# Patient Record
Sex: Female | Born: 1940 | Race: Black or African American | Hispanic: No | Marital: Married | Smoking: Never smoker
Health system: Southern US, Community
[De-identification: ages and names within clinical notes are randomized; demographics above are authoritative.]

## PROBLEM LIST (undated history)

## (undated) DIAGNOSIS — Z860100 Personal history of colon polyps, unspecified: Secondary | ICD-10-CM

## (undated) DIAGNOSIS — C50912 Malignant neoplasm of unspecified site of left female breast: Secondary | ICD-10-CM

## (undated) DIAGNOSIS — D0510 Intraductal carcinoma in situ of unspecified breast: Secondary | ICD-10-CM

## (undated) DIAGNOSIS — I10 Essential (primary) hypertension: Secondary | ICD-10-CM

## (undated) DIAGNOSIS — Z923 Personal history of irradiation: Secondary | ICD-10-CM

## (undated) DIAGNOSIS — H9190 Unspecified hearing loss, unspecified ear: Secondary | ICD-10-CM

## (undated) DIAGNOSIS — E785 Hyperlipidemia, unspecified: Secondary | ICD-10-CM

## (undated) DIAGNOSIS — Z972 Presence of dental prosthetic device (complete) (partial): Secondary | ICD-10-CM

## (undated) DIAGNOSIS — C50919 Malignant neoplasm of unspecified site of unspecified female breast: Secondary | ICD-10-CM

## (undated) DIAGNOSIS — R7303 Prediabetes: Secondary | ICD-10-CM

## (undated) DIAGNOSIS — M8580 Other specified disorders of bone density and structure, unspecified site: Secondary | ICD-10-CM

## (undated) DIAGNOSIS — K5900 Constipation, unspecified: Secondary | ICD-10-CM

## (undated) DIAGNOSIS — Z973 Presence of spectacles and contact lenses: Secondary | ICD-10-CM

## (undated) HISTORY — PX: COLONOSCOPY: SHX174

## (undated) HISTORY — DX: Prediabetes: R73.03

## (undated) HISTORY — DX: Intraductal carcinoma in situ of unspecified breast: D05.10

## (undated) HISTORY — DX: Malignant neoplasm of unspecified site of unspecified female breast: C50.919

## (undated) HISTORY — DX: Other specified disorders of bone density and structure, unspecified site: M85.80

## (undated) HISTORY — DX: Constipation, unspecified: K59.00

## (undated) HISTORY — DX: Hyperlipidemia, unspecified: E78.5

## (undated) HISTORY — PX: BREAST LUMPECTOMY: SHX2

## (undated) HISTORY — DX: Personal history of colon polyps, unspecified: Z86.0100

## (undated) HISTORY — DX: Personal history of colonic polyps: Z86.010

## (undated) HISTORY — PX: ABDOMINAL HYSTERECTOMY: SHX81

## (undated) HISTORY — DX: Unspecified hearing loss, unspecified ear: H91.90

---

## 1999-12-28 ENCOUNTER — Encounter: Admission: RE | Admit: 1999-12-28 | Discharge: 1999-12-28 | Payer: Self-pay | Admitting: Family Medicine

## 1999-12-28 ENCOUNTER — Encounter: Payer: Self-pay | Admitting: Family Medicine

## 2000-04-28 ENCOUNTER — Encounter: Payer: Self-pay | Admitting: Family Medicine

## 2000-04-28 ENCOUNTER — Encounter: Admission: RE | Admit: 2000-04-28 | Discharge: 2000-04-28 | Payer: Self-pay | Admitting: Family Medicine

## 2000-09-08 ENCOUNTER — Encounter (INDEPENDENT_AMBULATORY_CARE_PROVIDER_SITE_OTHER): Payer: Self-pay

## 2000-09-08 ENCOUNTER — Ambulatory Visit (HOSPITAL_COMMUNITY): Admission: RE | Admit: 2000-09-08 | Discharge: 2000-09-08 | Payer: Self-pay | Admitting: Gastroenterology

## 2001-04-30 ENCOUNTER — Encounter: Payer: Self-pay | Admitting: Family Medicine

## 2001-04-30 ENCOUNTER — Encounter: Admission: RE | Admit: 2001-04-30 | Discharge: 2001-04-30 | Payer: Self-pay | Admitting: Family Medicine

## 2001-10-13 ENCOUNTER — Emergency Department (HOSPITAL_COMMUNITY): Admission: EM | Admit: 2001-10-13 | Discharge: 2001-10-13 | Payer: Self-pay | Admitting: Emergency Medicine

## 2002-06-18 ENCOUNTER — Encounter: Payer: Self-pay | Admitting: Family Medicine

## 2002-06-18 ENCOUNTER — Encounter: Admission: RE | Admit: 2002-06-18 | Discharge: 2002-06-18 | Payer: Self-pay | Admitting: Family Medicine

## 2003-06-21 ENCOUNTER — Encounter: Payer: Self-pay | Admitting: Family Medicine

## 2003-06-21 ENCOUNTER — Encounter: Admission: RE | Admit: 2003-06-21 | Discharge: 2003-06-21 | Payer: Self-pay | Admitting: Family Medicine

## 2003-11-01 ENCOUNTER — Emergency Department (HOSPITAL_COMMUNITY): Admission: EM | Admit: 2003-11-01 | Discharge: 2003-11-01 | Payer: Self-pay | Admitting: Emergency Medicine

## 2004-05-22 ENCOUNTER — Ambulatory Visit (HOSPITAL_COMMUNITY): Admission: RE | Admit: 2004-05-22 | Discharge: 2004-05-22 | Payer: Self-pay | Admitting: Gastroenterology

## 2004-05-22 ENCOUNTER — Encounter (INDEPENDENT_AMBULATORY_CARE_PROVIDER_SITE_OTHER): Payer: Self-pay | Admitting: *Deleted

## 2004-06-28 ENCOUNTER — Encounter: Admission: RE | Admit: 2004-06-28 | Discharge: 2004-06-28 | Payer: Self-pay | Admitting: Family Medicine

## 2005-07-12 ENCOUNTER — Encounter: Admission: RE | Admit: 2005-07-12 | Discharge: 2005-07-12 | Payer: Self-pay | Admitting: Family Medicine

## 2006-07-14 ENCOUNTER — Encounter: Admission: RE | Admit: 2006-07-14 | Discharge: 2006-07-14 | Payer: Self-pay | Admitting: Gynecology

## 2006-07-23 ENCOUNTER — Encounter: Admission: RE | Admit: 2006-07-23 | Discharge: 2006-07-23 | Payer: Self-pay | Admitting: Gynecology

## 2007-07-27 ENCOUNTER — Encounter: Admission: RE | Admit: 2007-07-27 | Discharge: 2007-07-27 | Payer: Self-pay | Admitting: Family Medicine

## 2008-07-27 ENCOUNTER — Encounter: Admission: RE | Admit: 2008-07-27 | Discharge: 2008-07-27 | Payer: Self-pay | Admitting: Family Medicine

## 2009-07-28 ENCOUNTER — Encounter: Admission: RE | Admit: 2009-07-28 | Discharge: 2009-07-28 | Payer: Self-pay | Admitting: Family Medicine

## 2010-07-30 ENCOUNTER — Encounter: Admission: RE | Admit: 2010-07-30 | Discharge: 2010-07-30 | Payer: Self-pay | Admitting: Family Medicine

## 2010-11-03 ENCOUNTER — Encounter: Payer: Self-pay | Admitting: Gynecology

## 2011-03-01 NOTE — Op Note (Signed)
NAME:  Linda Singh, Linda Singh                           ACCOUNT NO.:  [id number]   MEDICAL RECORD NO.:  [id number]                   PATIENT TYPE:  AMB   LOCATION:  ENDO                                 FACILITY:  [facility]   PHYSICIAN:  Petra Kuba, M.D.                 DATE OF BIRTH:  1941/08/13   DATE OF PROCEDURE:  05/22/2004  DATE OF DISCHARGE:                                 OPERATIVE REPORT   PROCEDURE:  Colonoscopy with polypectomy.   INDICATIONS:  Family history of colon cancer and colon polyps.  Due for  repeat screening.   Consent was signed after risks, benefits, methods, options thoroughly in the  office.   MEDICINES USED:  Demerol 60, Versed 6.   PROCEDURE:  Rectal inspection is pertinent for external hemorrhoids, small.  Digital exam was negative.  The video pediatric adjustable colonoscope was  inserted and easily advanced around the colon to the cecum.  This did not  require any abdominal pressure or any position changes.  No abnormality was  seen on insertion.  The cecum was identified by the appendiceal orifice and  ileocecal valve.  The scope was slowly withdrawn.  The cecum and the  ascending were normal.  In the hepatic flexure, two tiny polyps were seen  and were hot biopsied x1 and put in the same container.  The scope was  slowly withdrawn.  No additional findings were seen as we slowly withdrew  back to the rectum.  Prep was adequate.  There was some liquid stool that  required washing and suctioning.  Anorectal pull-through and retroflexion  confirmed some small hemorrhoids.  The scope was reinserted a shortways up  the left side of the colon.  Air was suctioned.  The scope removed.  The  patient tolerated the procedure well.  There was no obvious immediate  complication.   ENDOSCOPIC DIAGNOSES:  1. Small internal/external hemorrhoid.  2. Two tiny hepatic flexure polyps, biopsied.  3. Otherwise within normal limits to the cecum.   PLAN:  Await pathology.   Probably recheck colon screening in five years.  Happy to see back p.r.n.  Otherwise return to the care of Dr. Gerri Spore for  the customary health-care maintenance to include yearly rectals and guaiacs.                                               Petra Kuba, M.D.    MEM/MEDQ  D:  05/22/2004  T:  05/22/2004  Job:  [id number]   cc:   Otilio Connors. Gerri Spore, M.D.  [address]  [place]  [place] [address]  Fax: [phone]

## 2011-03-01 NOTE — Procedures (Signed)
[facility]  Patient:    Linda Singh, Linda Singh                        MRN: [id number] Proc. Date: 09/08/00 Attending:  Petra Kuba, M.D.                           Procedure Report  PROCEDURE PERFORMED:  Colonoscopy with biopsy.  ENDOSCOPIST:  Petra Kuba, M.D.  INDICATIONS:  Family history of colon polyps in a patient due for colonic screening.  Consent was signed after risks, benefits, and options thoroughly discussed in the office.  MEDICATIONS:  Demerol 40 mg and Versed 4 mg.  FINDINGS:  Rectal inspection pertinent for small external hemorrhoids. Digital exam was negative.  DESCRIPTION OF PROCEDURE:  The video pediatric colonoscope was inserted very easily and advanced around the colon to the cecum.  This did not require any abdominal pressure, or any position changes.  The cecum was identified by the appendiceal orifice and the ileocecal valve.  Other than a rare diverticula on both the left and right side, no other obvious abnormalities were seen on insertion.  The prep was adequate.  There was some liquid stool that required washing and sectioning.  The scope was slowly withdrawn.  In the mid ascending and proximal transverse, two questionable tiny small polyps were seen and awaits cold biopsy x 2 or 3, and put the first container.  The scope was slowly withdrawn.  In the mid descending, a 2 mm polyp was seen and was cold biopsied x 2 and put in a second container.  Other than diverticula mentioned above, no abnormalities were seen as we slowly withdrew back to the rectum. Once back in the rectum, the scope was retroflexed, pertinent for some internal hemorrhoids.  The scope was drained and readvanced up the sigmoid area with the section of the scope removed.  The patient tolerated the procedure well.  There was no obvious immediate complication.  ENDOSCOPIC DIAGNOSES: 1. Internal/external hemorrhoids. 2. Rare left and right-sided  diverticula. 3. Questionable tiny to small polyps, one in the descending, cold    biopsy put in the second container and one each in the    transverse and ascending.  Cold biopsy first in the first    container. 4. Otherwise within normal limits.  PLAN:  Await pathology, but probably recheck colon screening in five years. Happy to see back p.r.n., otherwise return care to Dr. Clyde Canterbury for the customary yearly rectals, guaiacs, and other health care maintenance. DD:  09/08/00 TD:  09/08/00 Job: [id number]

## 2011-04-16 ENCOUNTER — Other Ambulatory Visit: Payer: Self-pay | Admitting: Family Medicine

## 2011-04-16 ENCOUNTER — Ambulatory Visit
Admission: RE | Admit: 2011-04-16 | Discharge: 2011-04-16 | Disposition: A | Discharge status: Home / Self Care | Payer: Medicare Other | Source: Ambulatory Visit | Attending: Family Medicine | Admitting: Family Medicine

## 2011-04-16 DIAGNOSIS — S838X9A Sprain of other specified parts of unspecified knee, initial encounter: Secondary | ICD-10-CM

## 2011-04-16 DIAGNOSIS — S86819A Strain of other muscle(s) and tendon(s) at lower leg level, unspecified leg, initial encounter: Secondary | ICD-10-CM

## 2011-07-01 ENCOUNTER — Other Ambulatory Visit: Payer: Self-pay | Admitting: Family Medicine

## 2011-07-01 DIAGNOSIS — Z1231 Encounter for screening mammogram for malignant neoplasm of breast: Secondary | ICD-10-CM

## 2011-08-01 ENCOUNTER — Ambulatory Visit
Admission: RE | Admit: 2011-08-01 | Discharge: 2011-08-01 | Disposition: A | Discharge status: Home / Self Care | Payer: Medicare Other | Source: Ambulatory Visit | Attending: Family Medicine | Admitting: Family Medicine

## 2011-08-01 DIAGNOSIS — Z1231 Encounter for screening mammogram for malignant neoplasm of breast: Secondary | ICD-10-CM

## 2012-01-03 DIAGNOSIS — I10 Essential (primary) hypertension: Secondary | ICD-10-CM | POA: Diagnosis not present

## 2012-01-03 DIAGNOSIS — L301 Dyshidrosis [pompholyx]: Secondary | ICD-10-CM | POA: Diagnosis not present

## 2012-01-03 DIAGNOSIS — F430 Acute stress reaction: Secondary | ICD-10-CM | POA: Diagnosis not present

## 2012-01-03 DIAGNOSIS — Z79899 Other long term (current) drug therapy: Secondary | ICD-10-CM | POA: Diagnosis not present

## 2012-02-04 DIAGNOSIS — I10 Essential (primary) hypertension: Secondary | ICD-10-CM | POA: Diagnosis not present

## 2012-05-19 DIAGNOSIS — Z79899 Other long term (current) drug therapy: Secondary | ICD-10-CM | POA: Diagnosis not present

## 2012-05-19 DIAGNOSIS — Z0000 Encounter for general adult medical examination without abnormal findings: Secondary | ICD-10-CM | POA: Diagnosis not present

## 2012-05-19 DIAGNOSIS — T300 Burn of unspecified body region, unspecified degree: Secondary | ICD-10-CM | POA: Diagnosis not present

## 2012-05-19 DIAGNOSIS — I10 Essential (primary) hypertension: Secondary | ICD-10-CM | POA: Diagnosis not present

## 2012-06-05 DIAGNOSIS — R7309 Other abnormal glucose: Secondary | ICD-10-CM | POA: Diagnosis not present

## 2012-06-05 DIAGNOSIS — E876 Hypokalemia: Secondary | ICD-10-CM | POA: Diagnosis not present

## 2012-06-30 ENCOUNTER — Other Ambulatory Visit: Payer: Self-pay | Admitting: Family Medicine

## 2012-06-30 DIAGNOSIS — Z1231 Encounter for screening mammogram for malignant neoplasm of breast: Secondary | ICD-10-CM

## 2012-08-03 ENCOUNTER — Ambulatory Visit
Admission: RE | Admit: 2012-08-03 | Discharge: 2012-08-03 | Disposition: A | Discharge status: Home / Self Care | Payer: Medicare Other | Source: Ambulatory Visit | Attending: Family Medicine | Admitting: Family Medicine

## 2012-08-03 DIAGNOSIS — Z1231 Encounter for screening mammogram for malignant neoplasm of breast: Secondary | ICD-10-CM | POA: Diagnosis not present

## 2012-08-07 DIAGNOSIS — I10 Essential (primary) hypertension: Secondary | ICD-10-CM | POA: Diagnosis not present

## 2012-09-29 DIAGNOSIS — R6889 Other general symptoms and signs: Secondary | ICD-10-CM | POA: Diagnosis not present

## 2012-11-20 DIAGNOSIS — Z79899 Other long term (current) drug therapy: Secondary | ICD-10-CM | POA: Diagnosis not present

## 2012-11-20 DIAGNOSIS — I10 Essential (primary) hypertension: Secondary | ICD-10-CM | POA: Diagnosis not present

## 2013-05-11 DIAGNOSIS — B079 Viral wart, unspecified: Secondary | ICD-10-CM | POA: Diagnosis not present

## 2013-06-30 ENCOUNTER — Other Ambulatory Visit: Payer: Self-pay

## 2013-06-30 DIAGNOSIS — Z1231 Encounter for screening mammogram for malignant neoplasm of breast: Secondary | ICD-10-CM

## 2013-08-04 ENCOUNTER — Ambulatory Visit
Admission: RE | Admit: 2013-08-04 | Discharge: 2013-08-04 | Disposition: A | Discharge status: Home / Self Care | Payer: Medicare Other | Source: Ambulatory Visit

## 2013-08-04 DIAGNOSIS — Z1231 Encounter for screening mammogram for malignant neoplasm of breast: Secondary | ICD-10-CM | POA: Diagnosis not present

## 2013-10-29 DIAGNOSIS — M25569 Pain in unspecified knee: Secondary | ICD-10-CM | POA: Diagnosis not present

## 2013-11-09 DIAGNOSIS — I10 Essential (primary) hypertension: Secondary | ICD-10-CM | POA: Diagnosis not present

## 2013-11-09 DIAGNOSIS — Z0000 Encounter for general adult medical examination without abnormal findings: Secondary | ICD-10-CM | POA: Diagnosis not present

## 2013-11-09 DIAGNOSIS — Z780 Asymptomatic menopausal state: Secondary | ICD-10-CM | POA: Diagnosis not present

## 2013-11-09 DIAGNOSIS — E785 Hyperlipidemia, unspecified: Secondary | ICD-10-CM | POA: Diagnosis not present

## 2013-11-19 DIAGNOSIS — H524 Presbyopia: Secondary | ICD-10-CM | POA: Diagnosis not present

## 2013-11-19 DIAGNOSIS — H2589 Other age-related cataract: Secondary | ICD-10-CM | POA: Diagnosis not present

## 2013-11-19 DIAGNOSIS — H52229 Regular astigmatism, unspecified eye: Secondary | ICD-10-CM | POA: Diagnosis not present

## 2013-11-19 DIAGNOSIS — H5200 Hypermetropia, unspecified eye: Secondary | ICD-10-CM | POA: Diagnosis not present

## 2013-12-06 DIAGNOSIS — M810 Age-related osteoporosis without current pathological fracture: Secondary | ICD-10-CM | POA: Diagnosis not present

## 2013-12-06 DIAGNOSIS — E8941 Symptomatic postprocedural ovarian failure: Secondary | ICD-10-CM | POA: Diagnosis not present

## 2013-12-16 DIAGNOSIS — J329 Chronic sinusitis, unspecified: Secondary | ICD-10-CM | POA: Diagnosis not present

## 2014-02-14 DIAGNOSIS — M79609 Pain in unspecified limb: Secondary | ICD-10-CM | POA: Diagnosis not present

## 2014-02-14 DIAGNOSIS — M25469 Effusion, unspecified knee: Secondary | ICD-10-CM | POA: Diagnosis not present

## 2014-03-22 DIAGNOSIS — J029 Acute pharyngitis, unspecified: Secondary | ICD-10-CM | POA: Diagnosis not present

## 2014-07-05 ENCOUNTER — Other Ambulatory Visit: Payer: Self-pay

## 2014-07-05 DIAGNOSIS — Z1231 Encounter for screening mammogram for malignant neoplasm of breast: Secondary | ICD-10-CM

## 2014-08-05 ENCOUNTER — Ambulatory Visit: Payer: Medicare Other

## 2014-08-15 ENCOUNTER — Ambulatory Visit: Payer: Medicare Other

## 2014-08-22 DIAGNOSIS — Z09 Encounter for follow-up examination after completed treatment for conditions other than malignant neoplasm: Secondary | ICD-10-CM | POA: Diagnosis not present

## 2014-08-22 DIAGNOSIS — K648 Other hemorrhoids: Secondary | ICD-10-CM | POA: Diagnosis not present

## 2014-08-22 DIAGNOSIS — Z86010 Personal history of colonic polyps: Secondary | ICD-10-CM | POA: Diagnosis not present

## 2014-08-30 ENCOUNTER — Ambulatory Visit: Payer: Medicare Other

## 2014-09-20 ENCOUNTER — Ambulatory Visit
Admission: RE | Admit: 2014-09-20 | Discharge: 2014-09-20 | Disposition: A | Discharge status: Home / Self Care | Payer: Medicare Other | Source: Ambulatory Visit

## 2014-09-20 DIAGNOSIS — Z1231 Encounter for screening mammogram for malignant neoplasm of breast: Secondary | ICD-10-CM | POA: Diagnosis not present

## 2014-09-22 ENCOUNTER — Other Ambulatory Visit: Payer: Self-pay | Admitting: Family Medicine

## 2014-09-22 DIAGNOSIS — R928 Other abnormal and inconclusive findings on diagnostic imaging of breast: Secondary | ICD-10-CM

## 2014-09-27 ENCOUNTER — Other Ambulatory Visit: Payer: Self-pay | Admitting: Family Medicine

## 2014-09-27 ENCOUNTER — Ambulatory Visit
Admission: RE | Admit: 2014-09-27 | Discharge: 2014-09-27 | Disposition: A | Discharge status: Home / Self Care | Payer: Medicare Other | Source: Ambulatory Visit | Attending: Family Medicine | Admitting: Family Medicine

## 2014-09-27 DIAGNOSIS — R928 Other abnormal and inconclusive findings on diagnostic imaging of breast: Secondary | ICD-10-CM

## 2014-09-27 DIAGNOSIS — R921 Mammographic calcification found on diagnostic imaging of breast: Secondary | ICD-10-CM | POA: Diagnosis not present

## 2014-10-11 ENCOUNTER — Ambulatory Visit
Admission: RE | Admit: 2014-10-11 | Discharge: 2014-10-11 | Disposition: A | Discharge status: Home / Self Care | Payer: Medicare Other | Source: Ambulatory Visit | Attending: Family Medicine | Admitting: Family Medicine

## 2014-10-11 DIAGNOSIS — D0512 Intraductal carcinoma in situ of left breast: Secondary | ICD-10-CM | POA: Diagnosis not present

## 2014-10-11 DIAGNOSIS — R928 Other abnormal and inconclusive findings on diagnostic imaging of breast: Secondary | ICD-10-CM

## 2014-10-11 DIAGNOSIS — R921 Mammographic calcification found on diagnostic imaging of breast: Secondary | ICD-10-CM | POA: Diagnosis not present

## 2014-10-12 ENCOUNTER — Other Ambulatory Visit: Payer: Self-pay | Admitting: Family Medicine

## 2014-10-12 DIAGNOSIS — D0512 Intraductal carcinoma in situ of left breast: Secondary | ICD-10-CM

## 2014-10-13 ENCOUNTER — Telehealth: Payer: Self-pay | Admitting: *Deleted

## 2014-10-13 DIAGNOSIS — C50412 Malignant neoplasm of upper-outer quadrant of left female breast: Secondary | ICD-10-CM

## 2014-10-13 DIAGNOSIS — Z170 Estrogen receptor positive status [ER+]: Secondary | ICD-10-CM | POA: Insufficient documentation

## 2014-10-13 NOTE — Telephone Encounter (Signed)
Confirmed [facility] for 10/19/14 at 12N .  Instructions and contact information given.

## 2014-10-18 ENCOUNTER — Other Ambulatory Visit: Payer: Self-pay | Admitting: Family Medicine

## 2014-10-18 ENCOUNTER — Ambulatory Visit
Admission: RE | Admit: 2014-10-18 | Discharge: 2014-10-18 | Disposition: A | Discharge status: Home / Self Care | Payer: Medicare Other | Source: Ambulatory Visit | Attending: Family Medicine | Admitting: Family Medicine

## 2014-10-18 DIAGNOSIS — D0512 Intraductal carcinoma in situ of left breast: Secondary | ICD-10-CM

## 2014-10-18 MED ORDER — GADOBENATE DIMEGLUMINE 529 MG/ML IV SOLN: 16.0000 mL | Freq: Once | INTRAVENOUS | Status: AC | PRN

## 2014-10-19 ENCOUNTER — Encounter: Payer: Self-pay | Admitting: Oncology

## 2014-10-19 ENCOUNTER — Ambulatory Visit: Payer: Medicare Other

## 2014-10-19 ENCOUNTER — Other Ambulatory Visit (INDEPENDENT_AMBULATORY_CARE_PROVIDER_SITE_OTHER): Payer: Self-pay | Admitting: General Surgery

## 2014-10-19 ENCOUNTER — Ambulatory Visit
Admission: RE | Admit: 2014-10-19 | Discharge: 2014-10-19 | Disposition: A | Discharge status: Home / Self Care | Payer: Medicare Other | Source: Ambulatory Visit | Attending: Radiation Oncology | Admitting: Radiation Oncology

## 2014-10-19 ENCOUNTER — Other Ambulatory Visit (HOSPITAL_BASED_OUTPATIENT_CLINIC_OR_DEPARTMENT_OTHER): Payer: Medicare Other

## 2014-10-19 ENCOUNTER — Ambulatory Visit (HOSPITAL_BASED_OUTPATIENT_CLINIC_OR_DEPARTMENT_OTHER): Payer: Medicare Other | Admitting: Oncology

## 2014-10-19 VITALS — BP 156/96 | HR 75 | Temp 98.3°F | Resp 18 | Ht 66.0 in | Wt 180.7 lb

## 2014-10-19 DIAGNOSIS — C50412 Malignant neoplasm of upper-outer quadrant of left female breast: Secondary | ICD-10-CM

## 2014-10-19 DIAGNOSIS — Z170 Estrogen receptor positive status [ER+]: Secondary | ICD-10-CM | POA: Diagnosis not present

## 2014-10-19 DIAGNOSIS — N641 Fat necrosis of breast: Secondary | ICD-10-CM | POA: Diagnosis not present

## 2014-10-19 DIAGNOSIS — C50912 Malignant neoplasm of unspecified site of left female breast: Secondary | ICD-10-CM

## 2014-10-19 DIAGNOSIS — D0512 Intraductal carcinoma in situ of left breast: Secondary | ICD-10-CM

## 2014-10-19 LAB — COMPREHENSIVE METABOLIC PANEL (CC13)
ALT: 11 U/L (ref 0–55)
ANION GAP: 9 meq/L (ref 3–11)
AST: 23 U/L (ref 5–34)
Albumin: 4 g/dL (ref 3.5–5.0)
Alkaline Phosphatase: 62 U/L (ref 40–150)
BUN: 14.5 mg/dL (ref 7.0–26.0)
CALCIUM: 9.5 mg/dL (ref 8.4–10.4)
CHLORIDE: 103 meq/L (ref 98–109)
CO2: 27 mEq/L (ref 22–29)
CREATININE: 0.9 mg/dL (ref 0.6–1.1)
EGFR: 74 mL/min/{1.73_m2} — AB (ref >90)
Glucose: 101 mg/dl (ref 70–140)
Potassium: 3.5 mEq/L (ref 3.5–5.1)
Sodium: 139 mEq/L (ref 136–145)
Total Bilirubin: 0.89 mg/dL (ref 0.20–1.20)
Total Protein: 7.1 g/dL (ref 6.4–8.3)

## 2014-10-19 LAB — CBC WITH DIFFERENTIAL/PLATELET
BASO%: 0.6 % (ref 0.0–2.0)
Basophils Absolute: 0 10*3/uL (ref 0.0–0.1)
EOS%: 2.3 % (ref 0.0–7.0)
Eosinophils Absolute: 0.1 10*3/uL (ref 0.0–0.5)
HCT: 43.4 % (ref 34.8–46.6)
HGB: 14.1 g/dL (ref 11.6–15.9)
LYMPH%: 27.3 % (ref 14.0–49.7)
MCH: 30.1 pg (ref 25.1–34.0)
MCHC: 32.4 g/dL (ref 31.5–36.0)
MCV: 92.8 fL (ref 79.5–101.0)
MONO#: 0.5 10*3/uL (ref 0.1–0.9)
MONO%: 8.4 % (ref 0.0–14.0)
NEUT#: 4 10*3/uL (ref 1.5–6.5)
NEUT%: 61.4 % (ref 38.4–76.8)
Platelets: 219 10*3/uL (ref 145–400)
RBC: 4.68 10*6/uL (ref 3.70–5.45)
RDW: 13.5 % (ref 11.2–14.5)
WBC: 6.5 10*3/uL (ref 3.9–10.3)
lymph#: 1.8 10*3/uL (ref 0.9–3.3)

## 2014-10-19 NOTE — Progress Notes (Signed)
Checked in new pt with no financial concerns at this time.  Pt has 2 insurances so financial assistance may not be needed but she has my card for any billing or insurance questions or concerns.

## 2014-10-19 NOTE — Progress Notes (Signed)
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 female who is seen today through the courtesy of Dr. Barry Dienes at the multidisciplinary breast clinic for evaluation of her DCIS of the left breast.  At the time of a mammogram on 09/20/2014 she was found to have calcifications within the left breast.  Additional views on December 15 showed calcifications within the upper-outer quadrant.  A biopsy on 10/11/2014 showed DCIS, intermediate grade with necrosis.  Her DCIS is ER/PR positive.  She attempted breast MR on 10/18/2014, but she became nauseated.  This may be rescheduled for January 8.  She is without complaints today.  Of note is that her son is a Education officer, environmental in [place].  She seen today with Dr. Barry Dienes and Dr. Jana Hakim.  PREVIOUS RADIATION THERAPY: No   PAST MEDICAL HISTORY:  has a past medical history of Breast cancer.     PAST SURGICAL HISTORY:  Past Surgical History  Procedure Laterality Date  . Abdominal hysterectomy       FAMILY HISTORY: Her father died of a heart attack following a motor vehicle accident at 4.  Her mother died from old age at 68.  SOCIAL HISTORY:  reports that she has never smoked. She does not have any smokeless tobacco history on file. She reports that she does not drink alcohol or use illicit drugs. Married, 2 sons.  One is a Customer service manager and the other a Education officer, environmental in [place]. 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   ALLERGIES: Amoxicillin and Contrast media   MEDICATIONS:  Current Outpatient Prescriptions  Medication Sig Dispense Refill  . hydrochlorothiazide (HYDRODIURIL) 25 MG tablet Take 25 mg by mouth daily.     No  current facility-administered medications for this encounter.     REVIEW OF SYSTEMS:  Pertinent items are noted in HPI.    PHYSICAL EXAM: Alert and oriented 74 year old after American female appearing much younger than her stated age.  Head and neck examination: Grossly unremarkable.  Nodes: Without palpable cervical, supraclavicular, or axillary lymphadenopathy.  Chest: Lungs clear.  Breasts: There is a biopsy wound at approximately 1:00 along the superior aspect of left breast with ecchymosis along the superior breast.  There is no discrete mass.  Right breast without masses or lesions.  Extremities: Without edema.    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  We discussed management options which include mastectomy versus partial mastectomy.  Considering her young physiologic age and knowing that her mother lived to be over 18, I would  offer her radiation therapy following conservative surgery.  This would be purely given to improve local control but would have no impact on overall survival or mastectomy rate.  An alternative would be for her to take antiestrogen therapy for 5 years in place of radiation therapy.  We discussed the potential acute and late toxicities of radiation therapy.  Prior to radiation therapy I would repeat her mammogram to confirm removal of all suspicious microcalcifications.  She may be a candidate for hypofractionated radiation therapy along with deep inspiration breath-hold technology.  Her prognosis is excellent.   PLAN: As discussed  above.  I can see her following her definitive surgery.    I spent 30  minutes face to face with the patient and more than 50% of that time was spent in counseling and/or coordination of care.

## 2014-10-19 NOTE — Progress Notes (Signed)
Linda Singh  Telephone:([phone]) [phone] Fax:(336) [phone]     ID: Linda Singh DOB: [birth year]  MR#: [id number]  TGG#:[id number]  Patient Care Team: London Pepper, MD as PCP - General (Family Medicine) Stark Klein, MD as Consulting Physician (General Surgery) Rexene Edison, MD as Consulting Physician (Radiation Oncology) Chauncey Cruel, MD as Consulting Physician (Oncology) Trinda Pascal, NP as Nurse Practitioner (Nurse Practitioner) Roselee Culver, RN as Registered Nurse [facility] Caleen Jobs, RN as Registered Nurse OTHER MD:  CHIEF COMPLAINT: Ductal carcinoma in situ  CURRENT TREATMENT: Awaiting definitive surgery   BREAST CANCER HISTORY: Linda Singh had routine screening mammography at the breast Center 09/20/2014 going showing her breast density to be category B. In the left breast new calcifications were noted, and these were further evaluated with left diagnostic mammography 09/27/2014. The calcifications when the upper outer quadrant, and were mildly irregular and linear. Biopsy was performed 10/11/2014 and showed ([id number]) ductal carcinoma in situ, grade 2, estrogen receptor 100% positive, progesterone receptor 30% positive, both with strong staining intensity.  MRI was scheduled but the patient was unable to tolerate it.  Her subsequent history is as detailed below  INTERVAL HISTORY: Linda Singh was evaluated in the multidisciplinary breast cancer clinic 10/19/2014. Her case was also presented at the multidisciplinary breast cancer conference that same morning  REVIEW OF SYSTEMS: There were no specific symptoms leading to the original mammogram, which was routinely scheduled. The patient denies unusual headaches, visual changes, nausea, vomiting, stiff neck, dizziness, or gait imbalance. There has been no cough, phlegm production, or pleurisy, no chest pain or pressure, and no change in bowel or bladder habits. The patient denies fever, rash,  bleeding, unexplained fatigue or unexplained weight loss. A detailed review of systems was otherwise entirely negative.  PAST MEDICAL HISTORY: Past Medical History  Diagnosis Date  . Breast cancer     PAST SURGICAL HISTORY: Past Surgical History  Procedure Laterality Date  . Abdominal hysterectomy      FAMILY HISTORY History reviewed. No pertinent family history. The patient's father died in an automobile accident at age 63. The patient's mother died at age 3. The patient had 2 brothers, 6 sisters. There is no history of breast or ovarian cancer in the family.  GYNECOLOGIC HISTORY:  No LMP recorded. Patient is postmenopausal. Menarche age 40. First live birth age 73. The patient is GX P3. She status post total abdominal hysterectomy with bilateral salpingo-oophorectomy. She did not take hormone replacement. 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 Her son Linda Singh works as a Psychologist, sport and exercise in [place]. Her son Linda Singh is Quarry manager. of [employer] in [place]. The patient has 3 grandchildren. She attends [employer]    ADVANCED DIRECTIVES: Not in place   HEALTH MAINTENANCE: History  Substance Use Topics  . Smoking status: Never Smoker   . Smokeless tobacco: Not on file  . Alcohol Use: No     Colonoscopy: 2015/[facility]  PAP: Status post hysterectomy  Bone density:  Lipid panel:  Allergies  Allergen Reactions  . Amoxicillin   . Contrast Media [Iodinated Diagnostic Agents] Nausea And Vomiting    Current Outpatient Prescriptions  Medication Sig Dispense Refill  . hydrochlorothiazide (HYDRODIURIL) 25 MG tablet Take 25 mg by mouth daily.     No current facility-administered medications for this visit.    OBJECTIVE:  Older African-American woman who appears younger than stated age 21 Vitals:   10/19/14  1250  BP: 156/96  Pulse: 75  Temp: 98.3 F (36.8 C)  Resp: 18     Body mass index is 29.18 kg/(m^2).    ECOG FS:0 - Asymptomatic  Ocular: Sclerae unicteric, pupils equal, round and reactive to light Ear-nose-throat: Oropharynx clear, dentition in good repair  Lymphatic: No cervical or supraclavicular adenopathy Lungs no rales or rhonchi, good excursion bilaterally Heart regular rate and rhythm, no murmur appreciated Abd soft, nontender, positive bowel sounds MSK no focal spinal tenderness, no joint edema Neuro: non-focal, well-oriented, appropriate affect Breasts: the right breast is unremarkable. The left breast is status post recent biopsy. There are no palpable masses. The left axilla is benign  LAB RESULTS:  CMP     Component Value Date/Time   NA 139 10/19/2014 1155   K 3.5 10/19/2014 1155   CO2 27 10/19/2014 1155   GLUCOSE 101 10/19/2014 1155   BUN 14.5 10/19/2014 1155   CREATININE 0.9 10/19/2014 1155   CALCIUM 9.5 10/19/2014 1155   PROT 7.1 10/19/2014 1155   ALBUMIN 4.0 10/19/2014 1155   AST 23 10/19/2014 1155   ALT 11 10/19/2014 1155   ALKPHOS 62 10/19/2014 1155   BILITOT 0.89 10/19/2014 1155    INo results found for: SPEP, UPEP  Lab Results  Component Value Date   WBC 6.5 10/19/2014   NEUTROABS 4.0 10/19/2014   HGB 14.1 10/19/2014   HCT 43.4 10/19/2014   MCV 92.8 10/19/2014   PLT 219 10/19/2014      Chemistry      Component Value Date/Time   NA 139 10/19/2014 1155   K 3.5 10/19/2014 1155   CO2 27 10/19/2014 1155   BUN 14.5 10/19/2014 1155   CREATININE 0.9 10/19/2014 1155      Component Value Date/Time   CALCIUM 9.5 10/19/2014 1155   ALKPHOS 62 10/19/2014 1155   AST 23 10/19/2014 1155   ALT 11 10/19/2014 1155   BILITOT 0.89 10/19/2014 1155       No results found for: LABCA2  No components found for: LABCA125  No results for input(s): INR in the last 168 hours.  Urinalysis No results found for: COLORURINE, APPEARANCEUR, LABSPEC,  PHURINE, GLUCOSEU, HGBUR, BILIRUBINUR, KETONESUR, PROTEINUR, UROBILINOGEN, NITRITE, LEUKOCYTESUR  STUDIES: Mm Digital Diagnostic Unilat L  09/27/2014   CLINICAL DATA:  Call back from screening for left breast calcifications.  EXAM: DIGITAL DIAGNOSTIC  LEFT MAMMOGRAM  COMPARISON:  Prior exams  ACR Breast Density Category b: There are scattered areas of fibroglandular density.  FINDINGS: Calcifications in the upper outer left breast are mildly irregular. They are arranged a linear fashion suggesting a ductal distribution. There is no associated mass or distortion. Calcifications increased gradually since 2012.  IMPRESSION: Suspicious left breast calcifications.  Biopsy recommended.  RECOMMENDATION: Stereotactic core needle biopsy of the left breast calcifications.  I have discussed the findings and recommendations with the patient. Results were also provided in writing at the conclusion of the visit. If applicable, a reminder letter will be sent to the patient regarding the next appointment.  BI-RADS CATEGORY  4: Suspicious.   Electronically Signed   By: Lajean Manes M.D.   On: 09/27/2014 14:33   Mm Digital Screening Bilateral  09/20/2014   CLINICAL DATA:  Screening.  EXAM: DIGITAL SCREENING BILATERAL MAMMOGRAM WITH CAD  COMPARISON:  Previous Exam(s)  ACR Breast Density Category b: There are scattered areas of fibroglandular density.  FINDINGS:  In the left breast, calcifications warrant further evaluation with magnified views. In the right breast, no findings suspicious for malignancy. Images were processed with CAD.  IMPRESSION: Further evaluation is suggested for calcifications in the left breast.  RECOMMENDATION: Diagnostic mammogram of the left breast. (Code:[id number])  The patient will be contacted regarding the findings, and additional imaging will be scheduled.  BI-RADS CATEGORY  0: Incomplete. Need additional imaging evaluation and/or prior mammograms for comparison.   Electronically Signed   By: Lovey Newcomer M.D.   On: 09/20/2014 14:16   Mm Lt Breast Bx W Loc Dev 1st Lesion Image Bx Spec Stereo Guide  10/12/2014   ADDENDUM REPORT: 10/12/2014 14:04  ADDENDUM: Patient's pathology has returned. Patient has DCIS with necrosis and calcifications. This is concordant with imaging. Patient was given these results by phone on 10/12/2014. Patient's questions were answered. Patient son may be calling for additional information. Patient son is a Psychologist, sport and exercise. Patient has set up with a breast biopsy on 10/18/2014 and a multi disciplinary clinic appointment on 10/19/2014.  Patient had a small amount of bleeding last night has some discomfort today but no other complaints from the biopsy.   Electronically Signed   By: Lajean Manes M.D.   On: 10/12/2014 14:04   10/12/2014   CLINICAL DATA:  Indeterminate group of calcifications spanning approximately 3 cm in the upper-outer quadrant of the left breast, junction of anterior and middle depth, initially identified on recent screening mammography.  EXAM: LEFT BREAST STEREOTACTIC CORE NEEDLE BIOPSY  COMPARISON:  Previous exams.  FINDINGS: The patient and I discussed the procedure of stereotactic-guided biopsy including benefits and alternatives. We discussed the high likelihood of a successful procedure. We discussed the risks of the procedure including infection, bleeding, tissue injury, clip migration, and inadequate sampling. Informed written consent was given. The usual time out protocol was performed immediately prior to the procedure.  Using sterile technique and 2% Lidocaine as local anesthetic, under stereotactic guidance, a 9 gauge vacuum device was used to perform core needle biopsy of the calcifications in the upper-outer quadrant of the left breast using a lateral approach. Specimen radiograph was performed showing calcifications in at least 4 of the core samples. Specimens with calcifications are identified for pathology.  At the conclusion of the procedure, a coil  shaped tissue marker clip was deployed into the biopsy cavity. Follow-up 2-view mammogram confirmed clip placement at the anterior edge of the group of calcifications. There is evidence of a small post biopsy hematoma.  IMPRESSION: Stereotactic-guided biopsy of indeterminate calcifications in the upper-outer quadrant of the left breast, junction of anterior and middle depth. No apparent complications apart from a small hematoma at the biopsy site.  Electronically Signed: By: Evangeline Dakin M.D. On: 10/11/2014 12:14    ASSESSMENT: 74 y.o. [place] woman status post left breast upper outer quadrant biopsy 10/11/2014 showing ductal carcinoma in situ, grade 2, estrogen and progesterone receptor positive  (1) breast conservation surgery planned  (2) radiation oncology felt benefit from radiation would be marginal and could be omitted  (3) patient will start antiestrogen therapy after recovery from surgery  PLAN: We spent the better part of today's hour-long appointment discussing the biology of breast cancer in general, and the specifics of the patient's tumor in particular. Ms. Brazee understands that ductal carcinoma in situ, or noninvasive breast cancer, means of the cancer cells are trapped in the ducts or tubes where they started. They cannot travel to a vital organ. For that reason  this cancer in itself is not a life-threatening.  It does need to be removed and we recommend a lumpectomy in her case. Because of necrosis and grade 2 she will have a sentinel lymph node sampling as well. Assuming no invasive disease is found, we felt she would not need both radiation and antiestrogen therapy. The consensus was that she could skip the radiation and proceed directly to anti-estrogens once she recovered from her surgery.  Today we discussed briefly the possible toxicities, side effects and complications of anti-estrogens. We will make a definitive decision as to which agent to use once she returns  to see me in early March. Before that visit we will obtain a copy of her most recent bone density  The patient has a good understanding of the overall plan. She agrees with it. She knows the goal of treatment in her case is cure. She will call with any problems that may develop before her next visit here.  Chauncey Cruel, MD   10/19/2014 3:12 PM Medical Oncology and Hematology [facility] [address] [place], [place] [address] Tel. [phone]    Fax. [phone]

## 2014-10-19 NOTE — Progress Notes (Signed)
[name] is a very pleasant 74 y.o. 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  Biopsy results revealed the tumor's hormone status as ER positive and PR positive.   She presents today with her cousin to the [facility]) for treatment consideration and recommendations from the breast surgeon, radiation oncologist, and medical oncologist.     I briefly met with [name] and her cousin during her [facility] visit today. We discussed the purpose of the Survivorship Clinic, which will include monitoring for recurrence, coordinating completion of age and gender-appropriate Singh screenings, promotion of overall wellness, as well as managing potential late/long-term side effects of anti-Singh treatments.    As of today, the treatment plan for [name] will likely include surgery and radiation therapy.  Anti-estrogen treatment will be considered for her as well.The intent of treatment for [name] is cure, therefore she will be eligible for the Survivorship Clinic upon her completion of treatment.  Her survivorship care plan (SCP) document will be drafted and updated throughout the course of her treatment trajectory. She will receive the SCP in an office visit with myself in the Survivorship Clinic once she has completed treatment.   [name] was encouraged to ask questions and all questions were answered to her satisfaction.  She was given my business card and encouraged to contact me with any concerns regarding survivorship.  I look forward to participating in her care.   Mike Craze, NP [place] [phone]

## 2014-10-20 ENCOUNTER — Other Ambulatory Visit (INDEPENDENT_AMBULATORY_CARE_PROVIDER_SITE_OTHER): Payer: Self-pay | Admitting: General Surgery

## 2014-10-20 DIAGNOSIS — C50912 Malignant neoplasm of unspecified site of left female breast: Secondary | ICD-10-CM

## 2014-10-20 NOTE — H&P (Signed)
Linda Singh 10/20/2014 5:53 PM Location: [facility] Patient #: [id number] DOB: 02-28-1941 Undefined / Language: Linda Singh / Race: Undefined Female  History of Present Illness Stark Klein MD; 10/20/2014 5:58 PM) The patient is a 74 year old female who presents with breast cancer. Patient is a 74 year old female is referred by Dr.Arron Orland Mustard for a new diagnosis of left breast cancer. She was found to have calcifications on screening mammogram. These were 2.6 cm in the upper outer quadrant of left breast. 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 She was undergoing an MRI and had severe nausea during the test which had to be aborted. She denies family history of cancer on either side. She does not have a personal history of cancer. She had menarche at age 39. She is status post hysterectomy. She has not used hormonal replacement therapy and used hormone contraception for 14 years. She had 3 children of which 2 are still living. Her age at first birth was 76. She is up-to-date on her colonoscopy and bone density study. She is very healthy. Her son is a Education officer, environmental in [place].   Review of Systems Stark Klein MD; 10/20/2014 5:58 PM) All other systems negative   Physical Exam Stark Klein MD; 10/20/2014 5:59 PM) General Mental Status-Alert. General Appearance-Consistent with stated age. Hydration-Well hydrated. Voice-Normal.  Head and Neck Head-normocephalic, atraumatic with no lesions or palpable masses. Trachea-midline. Thyroid Gland Characteristics - normal size and consistency.  Eye Eyeball - Bilateral-Extraocular movements intact. Sclera/Conjunctiva - Bilateral-No scleral icterus.  Chest and Lung Exam Chest and lung exam reveals -quiet, even and easy respiratory effort with no use of accessory muscles and on auscultation, normal breath sounds, no adventitious sounds and  normal vocal resonance. Inspection Chest Wall - Normal. Back - normal.  Breast Note: Breasts are symmetric bilaterally. They are ptotic bilaterally. She has no palpable masses in either breast. There is no lymphadenopathy. She does not have any nipple retraction or skin dimpling. She has no nipple discharge.   Cardiovascular Cardiovascular examination reveals -normal heart sounds, regular rate and rhythm with no murmurs and normal pedal pulses bilaterally.  Abdomen Inspection Inspection of the abdomen reveals - No Hernias. Palpation/Percussion Palpation and Percussion of the abdomen reveal - Soft, Non Tender, No Rebound tenderness, No Rigidity (guarding) and No hepatosplenomegaly. Auscultation Auscultation of the abdomen reveals - Bowel sounds normal.  Neurologic Neurologic evaluation reveals -alert and oriented x 3 with no impairment of recent or remote memory. Mental Status-Normal.  Musculoskeletal Global Assessment -Note: no gross deformities.  Normal Exam - Left-Upper Extremity Strength Normal and Lower Extremity Strength Normal. Normal Exam - Right-Upper Extremity Strength Normal and Lower Extremity Strength Normal.  Lymphatic Head & Neck  General Head & Neck Lymphatics: Bilateral - Description - Normal. Axillary  General Axillary Region: Bilateral - Description - Normal. Tenderness - Non Tender. Femoral & Inguinal  Generalized Femoral & Inguinal Lymphatics: Bilateral - Description - No Generalized lymphadenopathy.    Assessment & Plan Stark Klein MD; 10/20/2014 6:02 PM) PRIMARY CANCER OF UPPER OUTER QUADRANT OF LEFT FEMALE BREAST (174.4  C50.412) Impression: The patient is a good candidate for breast conservation. She is recommended to undergo a radioactive seed localized lumpectomy with sentinel lymph node biopsy. We recommend a sentinel lymph node biopsy based on the presence of comedo necrosis. We will wait schedule her surgery until she undergoes  MRI. If she requires additional biopsies or follow-up, we may need  to alter what we do. I discussed the surgery with the patient. We reviewed the number and type of incisions. I discussed the risks of surgery including bleeding, infection, damage to adjacent structures, pain, numbness or tingling, heart or lung problems, breast asymmetry, and possible lymphedema of the breast or arm.   60 Minutes were spent in evaluation, examination, counseling, and coordination of care. Mammogram images as well as labs were reviewed.     Signed by Stark Klein, MD (10/20/2014 6:04 PM)

## 2014-10-21 ENCOUNTER — Other Ambulatory Visit: Payer: Medicare Other

## 2014-10-21 ENCOUNTER — Encounter: Payer: Self-pay | Admitting: General Practice
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
## 2014-10-23 ENCOUNTER — Other Ambulatory Visit: Payer: Self-pay | Admitting: Oncology

## 2014-10-24 ENCOUNTER — Telehealth: Payer: Self-pay | Admitting: *Deleted

## 2014-10-24 ENCOUNTER — Encounter: Payer: Self-pay | Admitting: Radiation Oncology

## 2014-10-24 ENCOUNTER — Telehealth: Payer: Self-pay | Admitting: Oncology

## 2014-10-24 NOTE — Telephone Encounter (Signed)
Left vm for pt to return call concerning Linda Singh from 10/19/14. Contact information given.

## 2014-10-24 NOTE — Telephone Encounter (Signed)
Spoke to pt concerning [place] from 10/19/14. Pt denies questions or concerns regarding dx or treatment care plan. Confirmed surgery date and f/u with Dr. Jana Hakim. Informed pt that Dr. Charlton Amor scheduler will be calling her with an appt date approximately 2 wks after surgery. Encourage pt to call with needs. Received verbal understanding. Contact information given.

## 2014-10-24 NOTE — Progress Notes (Signed)
Chart note: I spoke with the patient's son, Dr. Tiney Rouge, a general surgeon in [place]([phone]), and he would like to have his mother receive radiation therapy.

## 2014-10-24 NOTE — Telephone Encounter (Signed)
, °

## 2014-10-26 ENCOUNTER — Encounter (HOSPITAL_BASED_OUTPATIENT_CLINIC_OR_DEPARTMENT_OTHER): Payer: Self-pay | Admitting: *Deleted

## 2014-10-26 NOTE — Progress Notes (Signed)
Pt has not had ekg many yr-she said she had irreg hr when she was very young-no ekg at pcp She only takes hctz-so will get ekg

## 2014-11-01 ENCOUNTER — Ambulatory Visit
Admission: RE | Admit: 2014-11-01 | Discharge: 2014-11-01 | Disposition: A | Discharge status: Home / Self Care | Payer: Medicare Other | Source: Ambulatory Visit | Attending: General Surgery | Admitting: General Surgery

## 2014-11-01 ENCOUNTER — Encounter (HOSPITAL_BASED_OUTPATIENT_CLINIC_OR_DEPARTMENT_OTHER)
Admission: RE | Admit: 2014-11-01 | Discharge: 2014-11-01 | Disposition: A | Discharge status: Home / Self Care | Payer: Medicare Other | Source: Ambulatory Visit | Attending: General Surgery | Admitting: General Surgery

## 2014-11-01 ENCOUNTER — Other Ambulatory Visit (INDEPENDENT_AMBULATORY_CARE_PROVIDER_SITE_OTHER): Payer: Self-pay | Admitting: General Surgery

## 2014-11-01 DIAGNOSIS — D0512 Intraductal carcinoma in situ of left breast: Secondary | ICD-10-CM | POA: Diagnosis not present

## 2014-11-01 DIAGNOSIS — C50912 Malignant neoplasm of unspecified site of left female breast: Secondary | ICD-10-CM | POA: Diagnosis not present

## 2014-11-01 DIAGNOSIS — R921 Mammographic calcification found on diagnostic imaging of breast: Secondary | ICD-10-CM | POA: Diagnosis not present

## 2014-11-01 DIAGNOSIS — I10 Essential (primary) hypertension: Secondary | ICD-10-CM | POA: Diagnosis not present

## 2014-11-02 ENCOUNTER — Encounter (HOSPITAL_BASED_OUTPATIENT_CLINIC_OR_DEPARTMENT_OTHER): Payer: Self-pay | Admitting: Anesthesiology

## 2014-11-02 ENCOUNTER — Ambulatory Visit
Admission: RE | Admit: 2014-11-02 | Discharge: 2014-11-02 | Disposition: A | Discharge status: Home / Self Care | Payer: Medicare Other | Source: Ambulatory Visit | Attending: General Surgery | Admitting: General Surgery

## 2014-11-02 ENCOUNTER — Ambulatory Visit (HOSPITAL_BASED_OUTPATIENT_CLINIC_OR_DEPARTMENT_OTHER): Payer: Medicare Other | Admitting: Anesthesiology

## 2014-11-02 ENCOUNTER — Encounter (HOSPITAL_BASED_OUTPATIENT_CLINIC_OR_DEPARTMENT_OTHER)
Admission: RE | Disposition: A | Discharge status: Home / Self Care | Payer: Self-pay | Source: Ambulatory Visit | Attending: General Surgery

## 2014-11-02 ENCOUNTER — Encounter (HOSPITAL_COMMUNITY)
Admission: RE | Admit: 2014-11-02 | Discharge: 2014-11-02 | Disposition: A | Discharge status: Home / Self Care | Payer: Medicare Other | Source: Ambulatory Visit | Attending: General Surgery | Admitting: General Surgery

## 2014-11-02 ENCOUNTER — Ambulatory Visit (HOSPITAL_BASED_OUTPATIENT_CLINIC_OR_DEPARTMENT_OTHER)
Admission: RE | Admit: 2014-11-02 | Discharge: 2014-11-02 | Disposition: A | Discharge status: Home / Self Care | Payer: Medicare Other | Source: Ambulatory Visit | Attending: General Surgery | Admitting: General Surgery

## 2014-11-02 DIAGNOSIS — C50912 Malignant neoplasm of unspecified site of left female breast: Secondary | ICD-10-CM

## 2014-11-02 DIAGNOSIS — I10 Essential (primary) hypertension: Secondary | ICD-10-CM | POA: Insufficient documentation

## 2014-11-02 DIAGNOSIS — G8918 Other acute postprocedural pain: Secondary | ICD-10-CM | POA: Diagnosis not present

## 2014-11-02 DIAGNOSIS — D0512 Intraductal carcinoma in situ of left breast: Secondary | ICD-10-CM | POA: Diagnosis not present

## 2014-11-02 DIAGNOSIS — R079 Chest pain, unspecified: Secondary | ICD-10-CM | POA: Diagnosis not present

## 2014-11-02 HISTORY — DX: Essential (primary) hypertension: I10

## 2014-11-02 HISTORY — DX: Presence of spectacles and contact lenses: Z97.3

## 2014-11-02 HISTORY — DX: Malignant neoplasm of unspecified site of left female breast: C50.912

## 2014-11-02 HISTORY — DX: Presence of dental prosthetic device (complete) (partial): Z97.2

## 2014-11-02 HISTORY — PX: RADIOACTIVE SEED GUIDED PARTIAL MASTECTOMY WITH AXILLARY SENTINEL LYMPH NODE BIOPSY: SHX6520

## 2014-11-02 SURGERY — RADIOACTIVE SEED GUIDED PARTIAL MASTECTOMY WITH AXILLARY SENTINEL LYMPH NODE BIOPSY
Anesthesia: General | Site: Breast | Laterality: Left

## 2014-11-02 MED ORDER — OXYCODONE HCL 5 MG PO TABS: 5.0000 mg | ORAL_TABLET | Freq: Once | ORAL | Status: DC | PRN

## 2014-11-02 MED ORDER — BUPIVACAINE-EPINEPHRINE (PF) 0.25% -1:200000 IJ SOLN
INTRAMUSCULAR | Status: AC
  Filled 2014-11-02: qty 30

## 2014-11-02 MED ORDER — OXYCODONE-ACETAMINOPHEN 5-325 MG PO TABS: 1.0000 | ORAL_TABLET | ORAL | Status: DC | PRN

## 2014-11-02 MED ORDER — CIPROFLOXACIN IN D5W 400 MG/200ML IV SOLN
400.0000 mg | INTRAVENOUS | Status: AC
  Administered 2014-11-02: 400 mg via INTRAVENOUS

## 2014-11-02 MED ORDER — HYDROMORPHONE HCL 1 MG/ML IJ SOLN: 0.2500 mg | INTRAMUSCULAR | Status: DC | PRN

## 2014-11-02 MED ORDER — FENTANYL CITRATE 0.05 MG/ML IJ SOLN: 50.0000 ug | INTRAMUSCULAR | Status: DC | PRN

## 2014-11-02 MED ORDER — LIDOCAINE HCL (CARDIAC) 20 MG/ML IV SOLN
INTRAVENOUS | Status: DC | PRN
  Administered 2014-11-02: 40 mg via INTRAVENOUS

## 2014-11-02 MED ORDER — MIDAZOLAM HCL 2 MG/2ML IJ SOLN
INTRAMUSCULAR | Status: AC
  Filled 2014-11-02: qty 2

## 2014-11-02 MED ORDER — SODIUM CHLORIDE 0.9 % IJ SOLN
INTRAMUSCULAR | Status: AC
  Filled 2014-11-02: qty 10

## 2014-11-02 MED ORDER — LACTATED RINGERS IV SOLN
INTRAVENOUS | Status: DC
  Administered 2014-11-02 (×2): via INTRAVENOUS

## 2014-11-02 MED ORDER — ONDANSETRON HCL 4 MG/2ML IJ SOLN: 4.0000 mg | Freq: Once | INTRAMUSCULAR | Status: DC | PRN

## 2014-11-02 MED ORDER — PROPOFOL 10 MG/ML IV BOLUS
INTRAVENOUS | Status: DC | PRN
  Administered 2014-11-02: 200 mg via INTRAVENOUS

## 2014-11-02 MED ORDER — BUPIVACAINE HCL 0.25 % IJ SOLN
INTRAMUSCULAR | Status: DC | PRN
  Administered 2014-11-02: 20 mL

## 2014-11-02 MED ORDER — GLYCOPYRROLATE 0.2 MG/ML IJ SOLN
INTRAMUSCULAR | Status: DC | PRN
  Administered 2014-11-02: 0.2 mg via INTRAVENOUS

## 2014-11-02 MED ORDER — FENTANYL CITRATE 0.05 MG/ML IJ SOLN
INTRAMUSCULAR | Status: AC
  Filled 2014-11-02: qty 6

## 2014-11-02 MED ORDER — MIDAZOLAM HCL 2 MG/2ML IJ SOLN: 1.0000 mg | INTRAMUSCULAR | Status: DC | PRN

## 2014-11-02 MED ORDER — BUPIVACAINE HCL (PF) 0.25 % IJ SOLN
INTRAMUSCULAR | Status: AC
  Filled 2014-11-02: qty 30

## 2014-11-02 MED ORDER — ONDANSETRON HCL 4 MG/2ML IJ SOLN
INTRAMUSCULAR | Status: DC | PRN
  Administered 2014-11-02: 4 mg via INTRAVENOUS

## 2014-11-02 MED ORDER — EPHEDRINE SULFATE 50 MG/ML IJ SOLN
INTRAMUSCULAR | Status: DC | PRN
  Administered 2014-11-02: 15 mg via INTRAVENOUS

## 2014-11-02 MED ORDER — METHYLENE BLUE 1 % INJ SOLN
INTRAMUSCULAR | Status: AC
  Filled 2014-11-02: qty 10

## 2014-11-02 MED ORDER — OXYCODONE HCL 5 MG/5ML PO SOLN: 5.0000 mg | Freq: Once | ORAL | Status: DC | PRN

## 2014-11-02 MED ORDER — TECHNETIUM TC 99M SULFUR COLLOID FILTERED
1.0000 | Freq: Once | INTRAVENOUS | Status: AC | PRN
Start: 2014-11-02 — End: 2014-11-02
  Administered 2014-11-02: 1 via INTRADERMAL

## 2014-11-02 MED ORDER — DEXAMETHASONE SODIUM PHOSPHATE 4 MG/ML IJ SOLN
INTRAMUSCULAR | Status: DC | PRN
  Administered 2014-11-02: 10 mg via INTRAVENOUS

## 2014-11-02 MED ORDER — CIPROFLOXACIN IN D5W 400 MG/200ML IV SOLN
INTRAVENOUS | Status: AC
  Filled 2014-11-02: qty 200

## 2014-11-02 MED ORDER — FENTANYL CITRATE 0.05 MG/ML IJ SOLN
INTRAMUSCULAR | Status: AC
  Filled 2014-11-02: qty 2

## 2014-11-02 MED ORDER — LIDOCAINE-EPINEPHRINE (PF) 1 %-1:200000 IJ SOLN
INTRAMUSCULAR | Status: AC
  Filled 2014-11-02: qty 10

## 2014-11-02 MED ORDER — FENTANYL CITRATE 0.05 MG/ML IJ SOLN
INTRAMUSCULAR | Status: DC | PRN
  Administered 2014-11-02: 100 ug via INTRAVENOUS

## 2014-11-02 SURGICAL SUPPLY — 69 items
APPLIER CLIP 9.375 MED OPEN (MISCELLANEOUS)
APR CLP MED 9.3 20 MLT OPN (MISCELLANEOUS)
BINDER BREAST LRG (GAUZE/BANDAGES/DRESSINGS) ×2 IMPLANT
BINDER BREAST MEDIUM (GAUZE/BANDAGES/DRESSINGS) IMPLANT
BINDER BREAST XLRG (GAUZE/BANDAGES/DRESSINGS) IMPLANT
BINDER BREAST XXLRG (GAUZE/BANDAGES/DRESSINGS) IMPLANT
BLADE HEX COATED 2.75 (ELECTRODE) ×3 IMPLANT
BLADE SURG 10 STRL SS (BLADE) ×3 IMPLANT
BLADE SURG 15 STRL LF DISP TIS (BLADE) ×1 IMPLANT
BLADE SURG 15 STRL SS (BLADE) ×3
BNDG COHESIVE 4X5 TAN STRL (GAUZE/BANDAGES/DRESSINGS) ×3 IMPLANT
CANISTER SUC SOCK COL 7IN (MISCELLANEOUS) IMPLANT
CANISTER SUCT 1200ML W/VALVE (MISCELLANEOUS) ×2 IMPLANT
CHLORAPREP W/TINT 26ML (MISCELLANEOUS) ×3 IMPLANT
CLIP APPLIE 9.375 MED OPEN (MISCELLANEOUS) IMPLANT
CLIP TI LARGE 6 (CLIP) ×3 IMPLANT
CLIP TI MEDIUM 6 (CLIP) ×7 IMPLANT
CLIP TI WIDE RED SMALL 6 (CLIP) IMPLANT
CLOSURE WOUND 1/2 X4 (GAUZE/BANDAGES/DRESSINGS) ×1
COVER MAYO STAND STRL (DRAPES) ×3 IMPLANT
COVER PROBE W GEL 5X96 (DRAPES) ×3 IMPLANT
DECANTER SPIKE VIAL GLASS SM (MISCELLANEOUS) IMPLANT
DEVICE DUBIN W/COMP PLATE 8390 (MISCELLANEOUS) ×3 IMPLANT
DRAPE UTILITY XL STRL (DRAPES) ×3 IMPLANT
DRSG PAD ABDOMINAL 8X10 ST (GAUZE/BANDAGES/DRESSINGS) IMPLANT
ELECT REM PT RETURN 9FT ADLT (ELECTROSURGICAL) ×3
ELECTRODE REM PT RTRN 9FT ADLT (ELECTROSURGICAL) ×1 IMPLANT
GLOVE BIO SURGEON STRL SZ 6 (GLOVE) ×3 IMPLANT
GLOVE BIO SURGEON STRL SZ7 (GLOVE) ×2 IMPLANT
GLOVE BIOGEL PI IND STRL 6.5 (GLOVE) ×1 IMPLANT
GLOVE BIOGEL PI IND STRL 7.0 (GLOVE) IMPLANT
GLOVE BIOGEL PI IND STRL 7.5 (GLOVE) IMPLANT
GLOVE BIOGEL PI INDICATOR 6.5 (GLOVE) ×2
GLOVE BIOGEL PI INDICATOR 7.0 (GLOVE) ×2
GLOVE BIOGEL PI INDICATOR 7.5 (GLOVE) ×2
GLOVE EXAM NITRILE EXT CUFF MD (GLOVE) ×2 IMPLANT
GLOVE SURG SS PI 7.5 STRL IVOR (GLOVE) ×2 IMPLANT
GOWN STRL REUS W/ TWL LRG LVL3 (GOWN DISPOSABLE) ×1 IMPLANT
GOWN STRL REUS W/TWL 2XL LVL3 (GOWN DISPOSABLE) ×3 IMPLANT
GOWN STRL REUS W/TWL LRG LVL3 (GOWN DISPOSABLE) ×6
KIT MARKER MARGIN INK (KITS) ×3 IMPLANT
LIQUID BAND (GAUZE/BANDAGES/DRESSINGS) ×3 IMPLANT
NDL HYPO 25X1 1.5 SAFETY (NEEDLE) ×1 IMPLANT
NEEDLE HYPO 25X1 1.5 SAFETY (NEEDLE) ×3 IMPLANT
NS IRRIG 1000ML POUR BTL (IV SOLUTION) ×2 IMPLANT
PACK BASIN DAY SURGERY FS (CUSTOM PROCEDURE TRAY) ×3 IMPLANT
PACK UNIVERSAL I (CUSTOM PROCEDURE TRAY) ×3 IMPLANT
PENCIL BUTTON HOLSTER BLD 10FT (ELECTRODE) ×3 IMPLANT
SHEET MEDIUM DRAPE 40X70 STRL (DRAPES) IMPLANT
SLEEVE SCD COMPRESS KNEE MED (MISCELLANEOUS) ×3 IMPLANT
SPONGE GAUZE 4X4 12PLY STER LF (GAUZE/BANDAGES/DRESSINGS) ×3 IMPLANT
SPONGE LAP 18X18 X RAY DECT (DISPOSABLE) ×3 IMPLANT
STOCKINETTE IMPERVIOUS LG (DRAPES) ×3 IMPLANT
STRIP CLOSURE SKIN 1/2X4 (GAUZE/BANDAGES/DRESSINGS) ×2 IMPLANT
SUT ETHILON 2 0 FS 18 (SUTURE) IMPLANT
SUT MNCRL AB 4-0 PS2 18 (SUTURE) ×3 IMPLANT
SUT MON AB 5-0 PS2 18 (SUTURE) IMPLANT
SUT SILK 2 0 SH (SUTURE) ×2 IMPLANT
SUT VIC AB 2-0 SH 27 (SUTURE) ×3
SUT VIC AB 2-0 SH 27XBRD (SUTURE) ×1 IMPLANT
SUT VIC AB 3-0 SH 27 (SUTURE) ×6
SUT VIC AB 3-0 SH 27X BRD (SUTURE) ×1 IMPLANT
SUT VIC AB 5-0 PS2 18 (SUTURE) IMPLANT
SYR CONTROL 10ML LL (SYRINGE) ×3 IMPLANT
TOWEL OR 17X24 6PK STRL BLUE (TOWEL DISPOSABLE) ×3 IMPLANT
TOWEL OR NON WOVEN STRL DISP B (DISPOSABLE) ×3 IMPLANT
TUBE CONNECTING 20'X1/4 (TUBING) ×1
TUBE CONNECTING 20X1/4 (TUBING) ×2 IMPLANT
YANKAUER SUCT BULB TIP NO VENT (SUCTIONS) ×2 IMPLANT

## 2014-11-02 NOTE — H&P (View-Only) (Signed)
Linda Singh 10/20/2014 5:53 PM Location: [facility] Patient #: [id number] DOB: 06-13-1941 Undefined / Language: [name] / Race: Undefined Female  History of Present Illness Stark Klein MD; 10/20/2014 5:58 PM) The patient is a 74 year old female who presents with breast cancer. Patient is a 74 year old female is referred by Dr.Arron Orland Mustard for a new diagnosis of left breast cancer. She was found to have calcifications on screening mammogram. These were 2.6 cm in the upper outer quadrant of left breast. 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 She was undergoing an MRI and had severe nausea during the test which had to be aborted. She denies family history of cancer on either side. She does not have a personal history of cancer. She had menarche at age 68. She is status post hysterectomy. She has not used hormonal replacement therapy and used hormone contraception for 14 years. She had 3 children of which 2 are still living. Her age at first birth was 32. She is up-to-date on her colonoscopy and bone density study. She is very healthy. Her son is a Education officer, environmental in [place].   Review of Systems Stark Klein MD; 10/20/2014 5:58 PM) All other systems negative   Physical Exam Stark Klein MD; 10/20/2014 5:59 PM) General Mental Status-Alert. General Appearance-Consistent with stated age. Hydration-Well hydrated. Voice-Normal.  Head and Neck Head-normocephalic, atraumatic with no lesions or palpable masses. Trachea-midline. Thyroid Gland Characteristics - normal size and consistency.  Eye Eyeball - Bilateral-Extraocular movements intact. Sclera/Conjunctiva - Bilateral-No scleral icterus.  Chest and Lung Exam Chest and lung exam reveals -quiet, even and easy respiratory effort with no use of accessory muscles and on auscultation, normal breath sounds, no adventitious sounds and  normal vocal resonance. Inspection Chest Wall - Normal. Back - normal.  Breast Note: Breasts are symmetric bilaterally. They are ptotic bilaterally. She has no palpable masses in either breast. There is no lymphadenopathy. She does not have any nipple retraction or skin dimpling. She has no nipple discharge.   Cardiovascular Cardiovascular examination reveals -normal heart sounds, regular rate and rhythm with no murmurs and normal pedal pulses bilaterally.  Abdomen Inspection Inspection of the abdomen reveals - No Hernias. Palpation/Percussion Palpation and Percussion of the abdomen reveal - Soft, Non Tender, No Rebound tenderness, No Rigidity (guarding) and No hepatosplenomegaly. Auscultation Auscultation of the abdomen reveals - Bowel sounds normal.  Neurologic Neurologic evaluation reveals -alert and oriented x 3 with no impairment of recent or remote memory. Mental Status-Normal.  Musculoskeletal Global Assessment -Note: no gross deformities.  Normal Exam - Left-Upper Extremity Strength Normal and Lower Extremity Strength Normal. Normal Exam - Right-Upper Extremity Strength Normal and Lower Extremity Strength Normal.  Lymphatic Head & Neck  General Head & Neck Lymphatics: Bilateral - Description - Normal. Axillary  General Axillary Region: Bilateral - Description - Normal. Tenderness - Non Tender. Femoral & Inguinal  Generalized Femoral & Inguinal Lymphatics: Bilateral - Description - No Generalized lymphadenopathy.    Assessment & Plan Stark Klein MD; 10/20/2014 6:02 PM) PRIMARY CANCER OF UPPER OUTER QUADRANT OF LEFT FEMALE BREAST (174.4  C50.412) Impression: The patient is a good candidate for breast conservation. She is recommended to undergo a radioactive seed localized lumpectomy with sentinel lymph node biopsy. We recommend a sentinel lymph node biopsy based on the presence of comedo necrosis. We will wait schedule her surgery until she undergoes  MRI. If she requires additional biopsies or follow-up, we may need  to alter what we do. I discussed the surgery with the patient. We reviewed the number and type of incisions. I discussed the risks of surgery including bleeding, infection, damage to adjacent structures, pain, numbness or tingling, heart or lung problems, breast asymmetry, and possible lymphedema of the breast or arm.   60 Minutes were spent in evaluation, examination, counseling, and coordination of care. Mammogram images as well as labs were reviewed.     Signed by Stark Klein, MD (10/20/2014 6:04 PM)

## 2014-11-02 NOTE — Interval H&P Note (Signed)
History and Physical Interval Note:  11/02/2014 2:01 PM  Linda Singh  has presented today for surgery, with the diagnosis of left breast cancer  The various methods of treatment have been discussed with the patient and family. After consideration of risks, benefits and other options for treatment, the patient has consented to  Procedure(s): RADIOACTIVE SEED GUIDED PARTIAL MASTECTOMY WITH AXILLARY SENTINEL LYMPH NODE BIOPSY (Left) as a surgical intervention .  The patient's history has been reviewed, patient examined, no change in status, stable for surgery.  I have reviewed the patient's chart and labs.  Questions were answered to the patient's satisfaction.     Kavan Devan

## 2014-11-02 NOTE — Discharge Instructions (Addendum)
[facility] °Office Phone Number [phone] ° °BREAST BIOPSY/ PARTIAL MASTECTOMY: POST OP INSTRUCTIONS ° °Always review your discharge instruction sheet given to you by the facility where your surgery was performed. ° °IF YOU HAVE DISABILITY OR FAMILY LEAVE FORMS, YOU MUST BRING THEM TO THE OFFICE FOR PROCESSING.  DO NOT GIVE THEM TO YOUR DOCTOR. ° °1. A prescription for pain medication may be given to you upon discharge.  Take your pain medication as prescribed, if needed.  If narcotic pain medicine is not needed, then you may take acetaminophen (Tylenol) or ibuprofen (Advil) as needed. °2. Take your usually prescribed medications unless otherwise directed °3. If you need a refill on your pain medication, please contact your pharmacy.  They will contact our office to request authorization.  Prescriptions will not be filled after 5pm or on week-ends. °4. You should eat very light the first 24 hours after surgery, such as soup, crackers, pudding, etc.  Resume your normal diet the day after surgery. °5. Most patients will experience some swelling and bruising in the breast.  Ice packs and a good support bra will help.  Swelling and bruising can take several days to resolve.  °6. It is common to experience some constipation if taking pain medication after surgery.  Increasing fluid intake and taking a stool softener will usually help or prevent this problem from occurring.  A mild laxative (Milk of Magnesia or Miralax) should be taken according to package directions if there are no bowel movements after 48 hours. °7. Unless discharge instructions indicate otherwise, you may remove your bandages 48 hours after surgery, and you may shower at that time.  You may have steri-strips (small skin tapes) in place directly over the incision.  These strips should be left on the skin for 7-10 days.   Any sutures or staples will be removed at the office during your follow-up visit. °8. ACTIVITIES:  You may resume  regular daily activities (gradually increasing) beginning the next day.  Wearing a good support bra or sports bra (or the breast binder) minimizes pain and swelling.  You may have sexual intercourse when it is comfortable. °a. You may drive when you no longer are taking prescription pain medication, you can comfortably wear a seatbelt, and you can safely maneuver your car and apply brakes. °b. RETURN TO WORK:  __________1 week_______________ °9. You should see your doctor in the office for a follow-up appointment approximately two weeks after your surgery.  Your doctor’s nurse will typically make your follow-up appointment when she calls you with your pathology report.  Expect your pathology report 2-3 business days after your surgery.  You may call to check if you do not hear from us after three days. ° ° °WHEN TO CALL YOUR DOCTOR: °1. Fever over 101.0 °2. Nausea and/or vomiting. °3. Extreme swelling or bruising. °4. Continued bleeding from incision. °5. Increased pain, redness, or drainage from the incision. ° °The clinic staff is available to answer your questions during regular business hours.  Please don’t hesitate to call and ask to speak to one of the nurses for clinical concerns.  If you have a medical emergency, go to the nearest emergency room or call 911.  A surgeon from [facility] is always on call at the hospital. ° °For further questions, please visit [email or web address]  ° ° °Post Anesthesia Home Care Instructions ° °Activity: °Get plenty of rest for the remainder of the day. A responsible adult should stay with you for 24   hours following the procedure.  °For the next 24 hours, DO NOT: °-Drive a car °-Operate machinery °-Drink alcoholic beverages °-Take any medication unless instructed by your physician °-Make any legal decisions or sign important papers. ° °Meals: °Start with liquid foods such as gelatin or soup. Progress to regular foods as tolerated. Avoid greasy, spicy, heavy  foods. If nausea and/or vomiting occur, drink only clear liquids until the nausea and/or vomiting subsides. Call your physician if vomiting continues. ° °Special Instructions/Symptoms: °Your throat may feel dry or sore from the anesthesia or the breathing tube placed in your throat during surgery. If this causes discomfort, gargle with warm salt water. The discomfort should disappear within 24 hours. ° ° °

## 2014-11-02 NOTE — Anesthesia Preprocedure Evaluation (Addendum)
Anesthesia Evaluation  Patient identified by MRN, date of birth, ID band Patient awake    Reviewed: Allergy & Precautions, NPO status , Patient's Chart, lab work & pertinent test results  Airway Mallampati: II  TM Distance: >3 FB Neck ROM: Full    Dental  (+) Teeth Intact, Dental Advisory Given   Pulmonary  breath sounds clear to auscultation        Cardiovascular hypertension, Rhythm:Regular Rate:Normal     Neuro/Psych    GI/Hepatic   Endo/Other    Renal/GU      Musculoskeletal   Abdominal   Peds  Hematology   Anesthesia Other Findings   Reproductive/Obstetrics                            Anesthesia Physical Anesthesia Plan  ASA: II  Anesthesia Plan: General   Post-op Pain Management:    Induction: Intravenous  Airway Management Planned: LMA  Additional Equipment:   Intra-op Plan:   Post-operative Plan:   Informed Consent: I have reviewed the patients History and Physical, chart, labs and discussed the procedure including the risks, benefits and alternatives for the proposed anesthesia with the patient or authorized representative who has indicated his/her understanding and acceptance.   Dental advisory given  Plan Discussed with: CRNA and Anesthesiologist  Anesthesia Plan Comments: (L. Breast Ca.  Plan GA with LMA and pectoralis block  Linda Singh)        Anesthesia Quick Evaluation

## 2014-11-02 NOTE — Anesthesia Procedure Notes (Addendum)
Anesthesia Regional Block:  Pectoralis block  Pre-Anesthetic Checklist: ,, timeout performed, Correct Patient, Correct Site, Correct Laterality, Correct Procedure, Correct Position, site marked, Risks and benefits discussed,  Surgical consent,  Pre-op evaluation,  At surgeon's request and post-op pain management  Laterality: Left  Prep: chloraprep       Needles:   Needle Type: Echogenic Stimulator Needle     Needle Length: 9cm 9 cm Needle Gauge: 22 and 22 G    Additional Needles:  Procedures: ultrasound guided (picture in chart) Pectoralis block Narrative:  Start time: 11/02/2014 11:50 AM End time: 11/02/2014 11:55 AM Injection made incrementally with aspirations every 5 mL.  Performed by: Personally   Additional Notes: 30 cc 0.5% Bupivacaine with 1:200 epi injected easily   Procedure Name: LMA Insertion Date/Time: 11/02/2014 2:12 PM Performed by: Melynda Ripple D Pre-anesthesia Checklist: Patient identified, Emergency Drugs available, Suction available and Patient being monitored Patient Re-evaluated:Patient Re-evaluated prior to inductionOxygen Delivery Method: Circle System Utilized Preoxygenation: Pre-oxygenation with 100% oxygen Intubation Type: IV induction Ventilation: Mask ventilation without difficulty LMA: LMA inserted LMA Size: 4.0 Number of attempts: 1 Airway Equipment and Method: Bite block Placement Confirmation: positive ETCO2 Tube secured with: Tape Dental Injury: Teeth and Oropharynx as per pre-operative assessment

## 2014-11-02 NOTE — Transfer of Care (Signed)
Immediate Anesthesia Transfer of Care Note  Patient: Linda Singh  Procedure(s) Performed: Procedure(s): RADIOACTIVE SEED GUIDED PARTIAL MASTECTOMY WITH AXILLARY SENTINEL LYMPH NODE BIOPSY (Left)  Patient Location: PACU  Anesthesia Type:General and Regional  Level of Consciousness: awake, alert  and oriented  Airway & Oxygen Therapy: Patient Spontanous Breathing and Patient connected to face mask oxygen  Post-op Assessment: Report given to PACU RN and Post -op Vital signs reviewed and stable  Post vital signs: Reviewed and stable  Complications: No apparent anesthesia complications

## 2014-11-02 NOTE — Op Note (Signed)
Left Breast Radioactive seed localized lumpectomy and sentinel lymph node biopsy  Indications: This patient presents with history of Left breast cancer  Pre-operative Diagnosis: Left breast cancer  Post-operative Diagnosis: Same  Surgeon: Stark Klein   Assistant:  Chester Holstein, PA-S  Anesthesia: General endotracheal anesthesia  ASA Class: 2  Procedure Details  The patient was seen in the Holding Room. The risks, benefits, complications, treatment options, and expected outcomes were discussed with the patient. The possibilities of bleeding, infection, the need for additional procedures, failure to diagnose a condition, and creating a complication requiring transfusion or operation were discussed with the patient. The patient concurred with the proposed plan, giving informed consent.  The site of surgery properly noted/marked. The patient was taken to Operating Room # 8, identified, and the procedure verified as Left Breast Seed localized Lumpectomy with sentinel lymph node biopsy. A Time Out was held and the above information confirmed.  The Left arm, breast, and chest were prepped and draped in standard fashion. The lumpectomy was performed by creating an transverse incision over the upper outer quadrant of the breast over the previously placed radioactive seed.  Dissection was carried down to around the point of maximum signal intensity. The cautery was used to perform the dissection.  Hemostasis was achieved with cautery. The edges of the cavity were marked with large clips, with one each medial, lateral, inferior and superior, and two clips posteriorly.   The specimen was inked with the margin marker paint kit.    Specimen radiography confirmed inclusion of the mammographic lesion, the clip, and the seed.  The background signal in the breast was zero.  The wound was irrigated and closed with 3-0 vicryl in layers and 4-0 monocryl subcuticular suture.    Using a hand-held gamma probe,  Left axillary sentinel nodes were identified transcutaneously.  An oblique incision was created below the axillary hairline.  Dissection was carried through the clavipectoral fascia.  3 level 2 axillary sentinel nodes were removed.  Counts per second were 1290, 870, and 280.    The background count was 8 cps.  The wound was irrigated.  Hemostasis was achieved with cautery.  The axillary incision was closed with a 3-0 vicryl deep dermal interrupted sutures and a 4-0 monocryl subcuticular closure.    Sterile dressings were applied. At the end of the operation, all sponge, instrument, and needle counts were correct.  Findings: grossly clear surgical margins and no adenopathy  Estimated Blood Loss:  min         Specimens: left breast lumpectomy and three axillary sentinel lymph nodes.             Complications:  None; patient tolerated the procedure well.         Disposition: PACU - hemodynamically stable.         Condition: stable

## 2014-11-02 NOTE — Progress Notes (Signed)
Assisted Dr. Joslin with left, ultrasound guided, pectoralis block. Side rails up, monitors on throughout procedure. See vital signs in flow sheet. Tolerated Procedure well. 

## 2014-11-03 ENCOUNTER — Encounter (HOSPITAL_BASED_OUTPATIENT_CLINIC_OR_DEPARTMENT_OTHER): Payer: Self-pay | Admitting: General Surgery

## 2014-11-07 ENCOUNTER — Telehealth (INDEPENDENT_AMBULATORY_CARE_PROVIDER_SITE_OTHER): Payer: Self-pay | Admitting: General Surgery

## 2014-11-07 NOTE — Telephone Encounter (Signed)
Discussed pathology with patient.  There was a small area of invasive cancer in the DCIS.  The lymph nodes were negative.  There were several close margins, and we need to go back to do a reexcision to decrease the chance of cancer coming back.

## 2014-11-09 NOTE — Anesthesia Postprocedure Evaluation (Signed)
  Anesthesia Post-op Note  Patient: Linda Singh  Procedure(s) Performed: Procedure(s): RADIOACTIVE SEED GUIDED PARTIAL MASTECTOMY WITH AXILLARY SENTINEL LYMPH NODE BIOPSY (Left)  Patient Location: PACU  Anesthesia Type:General  Level of Consciousness: awake, alert  and oriented  Airway and Oxygen Therapy: Patient Spontanous Breathing and Patient connected to nasal cannula oxygen  Post-op Pain: mild  Post-op Assessment: Post-op Vital signs reviewed, Patient's Cardiovascular Status Stable, Respiratory Function Stable, Patent Airway and No signs of Nausea or vomiting  Post-op Vital Signs: stable  Last Vitals:  Filed Vitals:   11/02/14 1633  BP: 127/84  Pulse: 86  Temp: 36.5 C  Resp: 18    Complications: No apparent anesthesia complications

## 2014-11-10 NOTE — Progress Notes (Signed)
Please put orders in Epic for Same day surgery 11-17-14 Thanks

## 2014-11-11 ENCOUNTER — Encounter (HOSPITAL_COMMUNITY): Payer: Self-pay | Admitting: *Deleted

## 2014-11-14 ENCOUNTER — Other Ambulatory Visit (INDEPENDENT_AMBULATORY_CARE_PROVIDER_SITE_OTHER): Payer: Self-pay | Admitting: Student

## 2014-11-14 ENCOUNTER — Encounter (HOSPITAL_COMMUNITY): Payer: Self-pay | Admitting: *Deleted

## 2014-11-15 ENCOUNTER — Encounter (HOSPITAL_COMMUNITY): Payer: Self-pay | Admitting: *Deleted

## 2014-11-15 NOTE — Progress Notes (Signed)
Called the Nursing office to see if there was any other telephone number for Linda Singh.  I told them that I have been calling her since last Thursday and unable to reach her.  That messages have been left with no response back to the call.  Stated they would try to call her and make Dr. Barry Dienes aware that she has not been reached for pre op interview for health history intake and pre op instructions given.  Willl give me a call back.

## 2014-11-17 ENCOUNTER — Encounter (HOSPITAL_COMMUNITY): Payer: Self-pay | Admitting: *Deleted

## 2014-11-17 ENCOUNTER — Ambulatory Visit (HOSPITAL_COMMUNITY)
Admission: RE | Admit: 2014-11-17 | Discharge: 2014-11-17 | Disposition: A | Discharge status: Home / Self Care | Payer: Medicare Other | Source: Ambulatory Visit | Attending: General Surgery | Admitting: General Surgery

## 2014-11-17 ENCOUNTER — Ambulatory Visit (HOSPITAL_COMMUNITY): Payer: Medicare Other | Admitting: Certified Registered"

## 2014-11-17 ENCOUNTER — Encounter (HOSPITAL_COMMUNITY)
Admission: RE | Disposition: A | Discharge status: Home / Self Care | Payer: Self-pay | Source: Ambulatory Visit | Attending: General Surgery

## 2014-11-17 ENCOUNTER — Ambulatory Visit: Payer: Medicare Other | Admitting: Radiation Oncology

## 2014-11-17 ENCOUNTER — Ambulatory Visit: Payer: Medicare Other

## 2014-11-17 DIAGNOSIS — C50912 Malignant neoplasm of unspecified site of left female breast: Secondary | ICD-10-CM | POA: Insufficient documentation

## 2014-11-17 DIAGNOSIS — D0512 Intraductal carcinoma in situ of left breast: Secondary | ICD-10-CM | POA: Diagnosis not present

## 2014-11-17 DIAGNOSIS — I10 Essential (primary) hypertension: Secondary | ICD-10-CM | POA: Diagnosis not present

## 2014-11-17 HISTORY — PX: RE-EXCISION OF BREAST LUMPECTOMY: SHX6048

## 2014-11-17 SURGERY — EXCISION, LESION, BREAST
Anesthesia: General | Site: Breast | Laterality: Left

## 2014-11-17 MED ORDER — ACETAMINOPHEN 650 MG RE SUPP
650.0000 mg | RECTAL | Status: DC | PRN
  Filled 2014-11-17: qty 1

## 2014-11-17 MED ORDER — FENTANYL CITRATE 0.05 MG/ML IJ SOLN
INTRAMUSCULAR | Status: DC | PRN
  Administered 2014-11-17: 50 ug via INTRAVENOUS

## 2014-11-17 MED ORDER — ACETAMINOPHEN 325 MG PO TABS
650.0000 mg | ORAL_TABLET | ORAL | Status: DC | PRN
  Administered 2014-11-17: 650 mg via ORAL
  Filled 2014-11-17: qty 2

## 2014-11-17 MED ORDER — CIPROFLOXACIN IN D5W 400 MG/200ML IV SOLN
INTRAVENOUS | Status: AC
  Filled 2014-11-17: qty 200

## 2014-11-17 MED ORDER — ONDANSETRON HCL 4 MG/2ML IJ SOLN
INTRAMUSCULAR | Status: DC | PRN
  Administered 2014-11-17: 4 mg via INTRAVENOUS

## 2014-11-17 MED ORDER — LIDOCAINE HCL (CARDIAC) 20 MG/ML IV SOLN
INTRAVENOUS | Status: AC
  Filled 2014-11-17: qty 5

## 2014-11-17 MED ORDER — BUPIVACAINE-EPINEPHRINE 0.25% -1:200000 IJ SOLN
INTRAMUSCULAR | Status: DC | PRN
  Administered 2014-11-17: 5 mL

## 2014-11-17 MED ORDER — PHENYLEPHRINE HCL 10 MG/ML IJ SOLN
INTRAMUSCULAR | Status: DC | PRN
  Administered 2014-11-17 (×2): 80 ug via INTRAVENOUS
  Administered 2014-11-17: 120 ug via INTRAVENOUS
  Administered 2014-11-17: 80 ug via INTRAVENOUS

## 2014-11-17 MED ORDER — MIDAZOLAM HCL 5 MG/5ML IJ SOLN
INTRAMUSCULAR | Status: DC | PRN
  Administered 2014-11-17: 2 mg via INTRAVENOUS

## 2014-11-17 MED ORDER — LIDOCAINE HCL 1 % IJ SOLN
INTRAMUSCULAR | Status: DC | PRN
  Administered 2014-11-17: 40 mg via INTRADERMAL

## 2014-11-17 MED ORDER — LIDOCAINE HCL 1 % IJ SOLN
INTRAMUSCULAR | Status: AC
  Filled 2014-11-17: qty 20

## 2014-11-17 MED ORDER — BUPIVACAINE-EPINEPHRINE (PF) 0.25% -1:200000 IJ SOLN
INTRAMUSCULAR | Status: AC
  Filled 2014-11-17: qty 30

## 2014-11-17 MED ORDER — ONDANSETRON HCL 4 MG/2ML IJ SOLN
INTRAMUSCULAR | Status: AC
  Filled 2014-11-17: qty 2

## 2014-11-17 MED ORDER — SODIUM CHLORIDE 0.9 % IV SOLN: 250.0000 mL | INTRAVENOUS | Status: DC | PRN

## 2014-11-17 MED ORDER — LACTATED RINGERS IV SOLN
INTRAVENOUS | Status: DC | PRN
  Administered 2014-11-17: 07:00:00 via INTRAVENOUS

## 2014-11-17 MED ORDER — FENTANYL CITRATE 0.05 MG/ML IJ SOLN
25.0000 ug | INTRAMUSCULAR | Status: DC | PRN
  Administered 2014-11-17: 50 ug via INTRAVENOUS

## 2014-11-17 MED ORDER — MEPERIDINE HCL 50 MG/ML IJ SOLN: 6.2500 mg | INTRAMUSCULAR | Status: DC | PRN

## 2014-11-17 MED ORDER — FENTANYL CITRATE 0.05 MG/ML IJ SOLN
INTRAMUSCULAR | Status: AC
  Filled 2014-11-17: qty 5

## 2014-11-17 MED ORDER — PHENYLEPHRINE HCL 10 MG/ML IJ SOLN
INTRAMUSCULAR | Status: AC
  Filled 2014-11-17: qty 1

## 2014-11-17 MED ORDER — ROCURONIUM BROMIDE 100 MG/10ML IV SOLN
INTRAVENOUS | Status: AC
  Filled 2014-11-17: qty 1

## 2014-11-17 MED ORDER — PROPOFOL 10 MG/ML IV BOLUS
INTRAVENOUS | Status: AC
  Filled 2014-11-17: qty 20

## 2014-11-17 MED ORDER — FENTANYL CITRATE 0.05 MG/ML IJ SOLN
INTRAMUSCULAR | Status: AC
  Filled 2014-11-17: qty 2

## 2014-11-17 MED ORDER — SODIUM CHLORIDE 0.9 % IJ SOLN: 3.0000 mL | Freq: Two times a day (BID) | INTRAMUSCULAR | Status: DC

## 2014-11-17 MED ORDER — OXYCODONE HCL 5 MG PO TABS: 5.0000 mg | ORAL_TABLET | ORAL | Status: DC | PRN

## 2014-11-17 MED ORDER — MIDAZOLAM HCL 2 MG/2ML IJ SOLN
INTRAMUSCULAR | Status: AC
  Filled 2014-11-17: qty 2

## 2014-11-17 MED ORDER — CIPROFLOXACIN IN D5W 400 MG/200ML IV SOLN
400.0000 mg | INTRAVENOUS | Status: AC
  Administered 2014-11-17: 400 mg via INTRAVENOUS

## 2014-11-17 MED ORDER — PROPOFOL 10 MG/ML IV BOLUS
INTRAVENOUS | Status: DC | PRN
  Administered 2014-11-17: 200 mg via INTRAVENOUS

## 2014-11-17 MED ORDER — PROPOFOL 10 MG/ML IV BOLUS
INTRAVENOUS | Status: AC
Start: 2014-11-17 — End: 2014-11-17
  Filled 2014-11-17: qty 20

## 2014-11-17 MED ORDER — PROMETHAZINE HCL 25 MG/ML IJ SOLN: 6.2500 mg | INTRAMUSCULAR | Status: DC | PRN

## 2014-11-17 MED ORDER — SODIUM CHLORIDE 0.9 % IJ SOLN: 3.0000 mL | INTRAMUSCULAR | Status: DC | PRN

## 2014-11-17 MED ORDER — LIDOCAINE HCL 1 % IJ SOLN
INTRAMUSCULAR | Status: DC | PRN
  Administered 2014-11-17: 5 mL

## 2014-11-17 SURGICAL SUPPLY — 39 items
APL SKNCLS STERI-STRIP NONHPOA (GAUZE/BANDAGES/DRESSINGS) ×1
BENZOIN TINCTURE PRP APPL 2/3 (GAUZE/BANDAGES/DRESSINGS) ×2 IMPLANT
BINDER BREAST XLRG (GAUZE/BANDAGES/DRESSINGS) ×2 IMPLANT
BLADE HEX COATED 2.75 (ELECTRODE) ×3 IMPLANT
BLADE SURG 15 STRL LF DISP TIS (BLADE) ×2 IMPLANT
BLADE SURG 15 STRL SS (BLADE) ×6
BLADE SURG SZ10 CARB STEEL (BLADE) ×3 IMPLANT
CLIP TI LARGE 6 (CLIP) ×2 IMPLANT
CLOSURE WOUND 1/2 X4 (GAUZE/BANDAGES/DRESSINGS) ×1
DECANTER SPIKE VIAL GLASS SM (MISCELLANEOUS) ×3 IMPLANT
DRAPE LAPAROTOMY TRNSV 102X78 (DRAPE) ×3 IMPLANT
ELECT REM PT RETURN 9FT ADLT (ELECTROSURGICAL) ×3
ELECTRODE REM PT RTRN 9FT ADLT (ELECTROSURGICAL) ×1 IMPLANT
GAUZE SPONGE 4X4 12PLY STRL (GAUZE/BANDAGES/DRESSINGS) ×3 IMPLANT
GAUZE SPONGE 4X4 16PLY XRAY LF (GAUZE/BANDAGES/DRESSINGS) ×1 IMPLANT
GLOVE BIOGEL PI IND STRL 7.0 (GLOVE) ×1 IMPLANT
GLOVE BIOGEL PI INDICATOR 7.0 (GLOVE) ×2
GLOVE ECLIPSE 8.0 STRL XLNG CF (GLOVE) ×3 IMPLANT
GLOVE INDICATOR 8.0 STRL GRN (GLOVE) ×6 IMPLANT
GOWN SPEC L4 XLG W/TWL (GOWN DISPOSABLE) ×5 IMPLANT
GOWN STRL REUS W/ TWL XL LVL3 (GOWN DISPOSABLE) ×3 IMPLANT
GOWN STRL REUS W/TWL LRG LVL3 (GOWN DISPOSABLE) ×3 IMPLANT
GOWN STRL REUS W/TWL XL LVL3 (GOWN DISPOSABLE) ×9
KIT BASIN OR (CUSTOM PROCEDURE TRAY) ×3 IMPLANT
MARKER SKIN DUAL TIP RULER LAB (MISCELLANEOUS) ×3 IMPLANT
NDL HYPO 25X1 1.5 SAFETY (NEEDLE) ×1 IMPLANT
NEEDLE HYPO 22GX1.5 SAFETY (NEEDLE) ×3 IMPLANT
NEEDLE HYPO 25X1 1.5 SAFETY (NEEDLE) ×3 IMPLANT
NS IRRIG 1000ML POUR BTL (IV SOLUTION) ×3 IMPLANT
PACK BASIC VI WITH GOWN DISP (CUSTOM PROCEDURE TRAY) ×3 IMPLANT
PAD ABD 8X10 STRL (GAUZE/BANDAGES/DRESSINGS) ×2 IMPLANT
PENCIL BUTTON HOLSTER BLD 10FT (ELECTRODE) ×3 IMPLANT
SOL PREP POV-IOD 4OZ 10% (MISCELLANEOUS) ×1 IMPLANT
STRIP CLOSURE SKIN 1/2X4 (GAUZE/BANDAGES/DRESSINGS) ×1 IMPLANT
SUT VIC AB 3-0 PS2 18 (SUTURE) ×6
SUT VIC AB 3-0 PS2 18XBRD (SUTURE) IMPLANT
SYR CONTROL 10ML LL (SYRINGE) ×3 IMPLANT
TOWEL OR 17X26 10 PK STRL BLUE (TOWEL DISPOSABLE) ×3 IMPLANT
YANKAUER SUCT BULB TIP 10FT TU (MISCELLANEOUS) ×3 IMPLANT

## 2014-11-17 NOTE — Transfer of Care (Signed)
Immediate Anesthesia Transfer of Care Note  Patient: Linda Singh  Procedure(s) Performed: Procedure(s): RE-EXCISION OF LEFT BREAST LUMPECTOMY (Left)  Patient Location: PACU  Anesthesia Type:General  Level of Consciousness: awake, alert , oriented and patient cooperative  Airway & Oxygen Therapy: Patient Spontanous Breathing and Patient connected to face mask oxygen  Post-op Assessment: Report given to RN, Post -op Vital signs reviewed and stable and Patient moving all extremities  Post vital signs: Reviewed and stable  Last Vitals:  Filed Vitals:   11/17/14 0548  BP: 145/87  Pulse: 69  Temp: 36.6 C  Resp: 20    Complications: No apparent anesthesia complications

## 2014-11-17 NOTE — Op Note (Signed)
Re-excisional Left Breast Lumpectomy   Indications: This patient presents with history of close and positive margins after partial mastectomy for Left breast cancer   Pre-operative Diagnosis: left breast cancer   Post-operative Diagnosis: left breast cancer   Surgeon: Stark Klein   Assistants: n/a   Anesthesia: general anesthesia and Local anesthesia 0.25% bupivacaine and lidocaine, with epinephrine   ASA Class: 2   Procedure Details  The patient was seen in the Holding Room. The risks, benefits, complications, treatment options, and expected outcomes were discussed with the patient. The possibilities of reaction to medication, pulmonary aspiration, bleeding, infection, the need for additional procedures, failure to diagnose a condition, and creating a complication requiring transfusion or operation were discussed with the patient. The patient concurred with the proposed plan, giving informed consent. The site of surgery properly noted/marked. The patient was taken to Operating Room # 10, identified, and the procedure verified as re-excision of left breast cancer.  After induction of anesthesia, the left breast and chest were prepped and draped in standard fashion.  The lumpectomy was performed by reopening the prior incision. Seroma was aspirated. The mastopexy sutures were removed. Additional margins were taken at inferior, posterior, medial borders of the partial mastectomy cavity. Dissection was carried down to the pectoral fascia. Orientation sutures were placed in the specimens. Hemostasis was achieved with cautery. The wound was irrigated and closed with a 3-0 Vicryl deep dermal interrupted and a 4-0 Monocryl subcuticular closure in layers.  Sterile dressings were applied. At the end of the operation, all sponge, instrument, and needle counts were correct.   Findings:  grossly clear surgical margins  Estimated Blood Loss: Minimal   Drains: none   Specimens: additional left  margins.   Complications: None; patient tolerated the procedure well.   Disposition: PACU - hemodynamically stable.   Condition: stable

## 2014-11-17 NOTE — Progress Notes (Signed)
As patient is ready for discharge, she requested tylenol PO as she is not sure she has any at home. Given tylenol for a pain level of two. Patient does not want to stay for reassessment.

## 2014-11-17 NOTE — Anesthesia Postprocedure Evaluation (Signed)
  Anesthesia Post-op Note  Patient: Linda Singh  Procedure(s) Performed: Procedure(s) (LRB): RE-EXCISION OF LEFT BREAST LUMPECTOMY (Left)  Patient Location: PACU  Anesthesia Type: General  Level of Consciousness: awake and alert   Airway and Oxygen Therapy: Patient Spontanous Breathing  Post-op Pain: mild  Post-op Assessment: Post-op Vital signs reviewed, Patient's Cardiovascular Status Stable, Respiratory Function Stable, Patent Airway and No signs of Nausea or vomiting  Last Vitals:  Filed Vitals:   11/17/14 1059  BP: 142/88  Pulse: 57  Temp: 36.4 C  Resp: 14    Post-op Vital Signs: stable   Complications: No apparent anesthesia complications

## 2014-11-17 NOTE — Interval H&P Note (Signed)
History and Physical Interval Note:  11/17/2014 7:28 AM  Linda Singh  has presented today for surgery, with the diagnosis of LEFT BREAST CANCER  The various methods of treatment have been discussed with the patient and family. After consideration of risks, benefits and other options for treatment, the patient has consented to  Procedure(s): RE-EXCISION OF LEFT BREAST LUMPECTOMY (Left) as a surgical intervention .  The patient's history has been reviewed, patient examined, no change in status, stable for surgery.  I have reviewed the patient's chart and labs.  Questions were answered to the patient's satisfaction.     [name]

## 2014-11-17 NOTE — Discharge Instructions (Addendum)
[place] Office Phone Number [phone]  BREAST BIOPSY/ PARTIAL MASTECTOMY: POST OP INSTRUCTIONS  Always review your discharge instruction sheet given to you by the facility where your surgery was performed.  IF YOU HAVE DISABILITY OR FAMILY LEAVE FORMS, YOU MUST BRING THEM TO THE OFFICE FOR PROCESSING.  DO NOT GIVE THEM TO YOUR DOCTOR.  1. A prescription for pain medication may be given to you upon discharge.  Take your pain medication as prescribed, if needed.  If narcotic pain medicine is not needed, then you may take acetaminophen (Tylenol) or ibuprofen (Advil) as needed. 2. Take your usually prescribed medications unless otherwise directed 3. If you need a refill on your pain medication, please contact your pharmacy.  They will contact our office to request authorization.  Prescriptions will not be filled after 5pm or on week-ends. 4. You should eat very light the first 24 hours after surgery, such as soup, crackers, pudding, etc.  Resume your normal diet the day after surgery. 5. Most patients will experience some swelling and bruising in the breast.  Ice packs and a good support bra will help.  Swelling and bruising can take several days to resolve.  6. It is common to experience some constipation if taking pain medication after surgery.  Increasing fluid intake and taking a stool softener will usually help or prevent this problem from occurring.  A mild laxative (Milk of Magnesia or Miralax) should be taken according to package directions if there are no bowel movements after 48 hours. 7. Unless discharge instructions indicate otherwise, you may remove your bandages 48 hours after surgery, and you may shower at that time.  You may have steri-strips (small skin tapes) in place directly over the incision.  These strips should be left on the skin for 7-10 days.   Any sutures or staples will be removed at the office during your follow-up visit. 8. ACTIVITIES:  You may resume  regular daily activities (gradually increasing) beginning the next day.  Wearing a good support bra or sports bra (or the breast binder) minimizes pain and swelling.  You may have sexual intercourse when it is comfortable. a. You may drive when you no longer are taking prescription pain medication, you can comfortably wear a seatbelt, and you can safely maneuver your car and apply brakes. b. RETURN TO WORK:  __________1 week_______________ 9. You should see your doctor in the office for a follow-up appointment approximately two weeks after your surgery.  Your doctors nurse will typically make your follow-up appointment when she calls you with your pathology report.  Expect your pathology report 2-3 business days after your surgery.  You may call to check if you do not hear from [place] after three days.   WHEN TO CALL YOUR DOCTOR: 1. Fever over 101.0 2. Nausea and/or vomiting. 3. Extreme swelling or bruising. 4. Continued bleeding from incision. 5. Increased pain, redness, or drainage from the incision.  The clinic staff is available to answer your questions during regular business hours.  Please dont hesitate to call and ask to speak to one of the nurses for clinical concerns.  If you have a medical emergency, go to the nearest emergency room or call 911.  A surgeon from [facility] is always on call at the hospital.  For further questions, please visit [email or web address]        General Anesthesia, Care After Refer to this sheet in the next few weeks. These instructions provide you with information on caring for  yourself after your procedure. Your health care provider may also give you more specific instructions. Your treatment has been planned according to current medical practices, but problems sometimes occur. Call your health care provider if you have any problems or questions after your procedure. WHAT TO EXPECT AFTER THE PROCEDURE After the procedure, it is typical  to experience:  Sleepiness.  Nausea and vomiting. HOME CARE INSTRUCTIONS  For the first 24 hours after general anesthesia:  Have a responsible person with you.  Do not drive a car. If you are alone, do not take public transportation.  Do not drink alcohol.  Do not take medicine that has not been prescribed by your health care provider.  Do not sign important papers or make important decisions.  You may resume a normal diet and activities as directed by your health care provider.  Change bandages (dressings) as directed.  If you have questions or problems that seem related to general anesthesia, call the hospital and ask for the anesthetist or anesthesiologist on call. SEEK MEDICAL CARE IF:  You have nausea and vomiting that continue the day after anesthesia.  You develop a rash. SEEK IMMEDIATE MEDICAL CARE IF:   You have difficulty breathing.  You have chest pain.  You have any allergic problems. Document Released: 01/06/2001 Document Revised: 10/05/2013 Document Reviewed: 04/15/2013 [facility] Patient Information 2015 [place], [place]. This information is not intended to replace advice given to you by your health care provider. Make sure you discuss any questions you have with your health care provider.

## 2014-11-17 NOTE — H&P (View-Only) (Signed)
Linda Singh 10/20/2014 5:53 PM Location: [facility] Patient #: [id number] DOB: Nov 05, 1940 Undefined / Language: [name] / Race: Undefined Female  History of Present Illness Stark Klein MD; 10/20/2014 5:58 PM) The patient is a 74 year old female who presents with breast cancer. Patient is a 74 year old female is referred by Dr.Arron Orland Mustard for a new diagnosis of left breast cancer. She was found to have calcifications on screening mammogram. These were 2.6 cm in the upper outer quadrant of left breast. 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 She was undergoing an MRI and had severe nausea during the test which had to be aborted. She denies family history of cancer on either side. She does not have a personal history of cancer. She had menarche at age 53. She is status post hysterectomy. She has not used hormonal replacement therapy and used hormone contraception for 14 years. She had 3 children of which 2 are still living. Her age at first birth was 40. She is up-to-date on her colonoscopy and bone density study. She is very healthy. Her son is a Education officer, environmental in [place].   Review of Systems Stark Klein MD; 10/20/2014 5:58 PM) All other systems negative   Physical Exam Stark Klein MD; 10/20/2014 5:59 PM) General Mental Status-Alert. General Appearance-Consistent with stated age. Hydration-Well hydrated. Voice-Normal.  Head and Neck Head-normocephalic, atraumatic with no lesions or palpable masses. Trachea-midline. Thyroid Gland Characteristics - normal size and consistency.  Eye Eyeball - Bilateral-Extraocular movements intact. Sclera/Conjunctiva - Bilateral-No scleral icterus.  Chest and Lung Exam Chest and lung exam reveals -quiet, even and easy respiratory effort with no use of accessory muscles and on auscultation, normal breath sounds, no adventitious sounds and  normal vocal resonance. Inspection Chest Wall - Normal. Back - normal.  Breast Note: Breasts are symmetric bilaterally. They are ptotic bilaterally. She has no palpable masses in either breast. There is no lymphadenopathy. She does not have any nipple retraction or skin dimpling. She has no nipple discharge.   Cardiovascular Cardiovascular examination reveals -normal heart sounds, regular rate and rhythm with no murmurs and normal pedal pulses bilaterally.  Abdomen Inspection Inspection of the abdomen reveals - No Hernias. Palpation/Percussion Palpation and Percussion of the abdomen reveal - Soft, Non Tender, No Rebound tenderness, No Rigidity (guarding) and No hepatosplenomegaly. Auscultation Auscultation of the abdomen reveals - Bowel sounds normal.  Neurologic Neurologic evaluation reveals -alert and oriented x 3 with no impairment of recent or remote memory. Mental Status-Normal.  Musculoskeletal Global Assessment -Note: no gross deformities.  Normal Exam - Left-Upper Extremity Strength Normal and Lower Extremity Strength Normal. Normal Exam - Right-Upper Extremity Strength Normal and Lower Extremity Strength Normal.  Lymphatic Head & Neck  General Head & Neck Lymphatics: Bilateral - Description - Normal. Axillary  General Axillary Region: Bilateral - Description - Normal. Tenderness - Non Tender. Femoral & Inguinal  Generalized Femoral & Inguinal Lymphatics: Bilateral - Description - No Generalized lymphadenopathy.    Assessment & Plan Stark Klein MD; 10/20/2014 6:02 PM) PRIMARY CANCER OF UPPER OUTER QUADRANT OF LEFT FEMALE BREAST (174.4  C50.412) Impression: The patient is a good candidate for breast conservation. She is recommended to undergo a radioactive seed localized lumpectomy with sentinel lymph node biopsy. We recommend a sentinel lymph node biopsy based on the presence of comedo necrosis. We will wait schedule her surgery until she undergoes  MRI. If she requires additional biopsies or follow-up, we may need  to alter what we do. I discussed the surgery with the patient. We reviewed the number and type of incisions. I discussed the risks of surgery including bleeding, infection, damage to adjacent structures, pain, numbness or tingling, heart or lung problems, breast asymmetry, and possible lymphedema of the breast or arm.   60 Minutes were spent in evaluation, examination, counseling, and coordination of care. Mammogram images as well as labs were reviewed.     Signed by Stark Klein, MD (10/20/2014 6:04 PM)

## 2014-11-17 NOTE — Anesthesia Preprocedure Evaluation (Addendum)
Anesthesia Evaluation  Patient identified by MRN, date of birth, ID band Patient awake    Reviewed: Allergy & Precautions, NPO status , Patient's Chart, lab work & pertinent test results  Airway        Dental   Pulmonary neg pulmonary ROS,          Cardiovascular hypertension, Pt. on medications     Neuro/Psych negative neurological ROS  negative psych ROS   GI/Hepatic negative GI ROS, Neg liver ROS,   Endo/Other  negative endocrine ROS  Renal/GU negative Renal ROS  negative genitourinary   Musculoskeletal negative musculoskeletal ROS (+)   Abdominal   Peds negative pediatric ROS (+)  Hematology negative hematology ROS (+)   Anesthesia Other Findings   Reproductive/Obstetrics negative OB ROS                          Anesthesia Physical Anesthesia Plan  ASA: II  Anesthesia Plan: General   Post-op Pain Management:    Induction: Intravenous  Airway Management Planned: LMA  Additional Equipment:   Intra-op Plan:   Post-operative Plan: Extubation in OR  Informed Consent: I have reviewed the patients History and Physical, chart, labs and discussed the procedure including the risks, benefits and alternatives for the proposed anesthesia with the patient or authorized representative who has indicated his/her understanding and acceptance.   Dental advisory given  Plan Discussed with: CRNA  Anesthesia Plan Comments:        Anesthesia Quick Evaluation                                  Anesthesia Evaluation  Patient identified by MRN, date of birth, ID band Patient awake

## 2014-11-18 ENCOUNTER — Encounter (HOSPITAL_COMMUNITY): Payer: Self-pay | Admitting: General Surgery

## 2014-11-29 ENCOUNTER — Other Ambulatory Visit: Payer: Self-pay | Admitting: Oncology

## 2014-11-30 ENCOUNTER — Encounter: Payer: Self-pay | Admitting: Radiation Oncology

## 2014-11-30 NOTE — Progress Notes (Signed)
Location of Breast Cancer: Left Breast, Upper outer Quadrant  Histology per Pathology Report: Left Breast  11/17/14 Diagnosis 1. Breast, excision, new posterior margin - MICROSCOPIC FOCUS OF RESIDUAL INVASIVE DUCTAL CARCINOMA AND DUCTAL CARCINOMA IN SITU. - PREVIOUS EXCISIONAL SITE CHANGES. - RESECTION MARGINS, NEGATIVE FOR ATYPIA OR MALIGNANCY. 2. Breast, excision, new medial margin - BENIGN BREAST TISSUE WITH FIBROCYSTIC CHANGES AND PREVIOUS EXCISIONAL SITE CHANGES. - NO ATYPIA OR MALIGNANCY. - NEW MEDIAL MARGIN, NEGATIVE FOR ATYPIA OR MALIGNANCY.   11/02/14 Diagnosis 1. Breast, lumpectomy, left - INVASIVE DUCTAL CARCINOMA, 0.3 CM. - INVASIVE CARCINOMA CLOSEST MARGIN IS INFERIOR AT 0.3 CM. - HIGH GRADE DUCTAL CARCINOMA IN SITU. - DUCTAL CARCINOMA IN SITU FOCALLY INVOLVING INFERIOR MARGIN AND LESS THAN 0.1 CM FOCALLY FROM THE POSTERIOR AND MEDIAL MARGINS. 2. Lymph node, sentinel, biopsy, left axillary #1 - ONE BENIGN LYMPH NODE (0/1). 3. Lymph node, sentinel, biopsy, left axillary #2 - ONE BENIGN LYMPH NODE (0/1). 4. Lymph node, sentinel, biopsy, left axillary #3 - ONE BENIGN LYMPH NODE (0/1).  3. Breast, excision, new inferior margin - DUCTAL CARCINOMA IN SITU, PLEASE SEE COMMENT. - BACKGROUND FIBROCYSTIC CHANGES AND PREVIOUS EXCISIONAL SITE CHANGES  Receptor Status: 11/02/14  ER(100%), PR (76%), Her2-neu (No amplification), Ki-67(17%)  Linda Singh is a 74 year old female is referred by Dr.Arron Orland Mustard for a new diagnosis of left breast cancer. She was found to have calcifications on screening mammogram. These were 2.6 cm in the upper outer quadrant of left breast.  Past/Anticipated interventions by surgeon, if any: Stark Klein: Lumpectomy Left Breast  Past/Anticipated interventions by medical oncology, if any: Chemotherapy- denies  Lymphedema issues, if any: no  Pain issues, if any: breast tenderness, denies pain    SAFETY ISSUES:  Prior radiation?  No  Pacemaker/ICD? No  Possible current pregnancy?No  Is the patient on methotrexate? No  Current Complaints / other details:breast tenderness, possible breast swelling    Menarche age 2. First live birth age 65. The patient is GX P3. She status post total abdominal hysterectomy with bilateral salpingo-oophorectomy. She did not take hormone replacement. She did take oral contraceptives remotely for approximately 14 years with no complications     Cheston, Crista Curb, RN 11/30/2014,2:07 PM

## 2014-12-01 ENCOUNTER — Ambulatory Visit: Payer: Medicare Other

## 2014-12-01 ENCOUNTER — Encounter: Payer: Self-pay | Admitting: Radiation Oncology

## 2014-12-01 ENCOUNTER — Ambulatory Visit
Admission: RE | Admit: 2014-12-01 | Discharge: 2014-12-01 | Disposition: A | Discharge status: Home / Self Care | Payer: Medicare Other | Source: Ambulatory Visit | Attending: Radiation Oncology | Admitting: Radiation Oncology

## 2014-12-01 VITALS — BP 148/101 | HR 80 | Temp 98.3°F | Resp 12 | Wt 180.7 lb

## 2014-12-01 DIAGNOSIS — D0512 Intraductal carcinoma in situ of left breast: Secondary | ICD-10-CM | POA: Diagnosis not present

## 2014-12-01 DIAGNOSIS — Z9012 Acquired absence of left breast and nipple: Secondary | ICD-10-CM | POA: Insufficient documentation

## 2014-12-01 DIAGNOSIS — Z170 Estrogen receptor positive status [ER+]: Secondary | ICD-10-CM | POA: Insufficient documentation

## 2014-12-01 DIAGNOSIS — L599 Disorder of the skin and subcutaneous tissue related to radiation, unspecified: Secondary | ICD-10-CM | POA: Diagnosis not present

## 2014-12-01 DIAGNOSIS — Z510 Encounter for antineoplastic radiation therapy: Secondary | ICD-10-CM | POA: Diagnosis not present

## 2014-12-01 DIAGNOSIS — L814 Other melanin hyperpigmentation: Secondary | ICD-10-CM | POA: Insufficient documentation

## 2014-12-01 DIAGNOSIS — C50412 Malignant neoplasm of upper-outer quadrant of left female breast: Secondary | ICD-10-CM

## 2014-12-01 HISTORY — DX: Malignant neoplasm of unspecified site of left female breast: C50.912

## 2014-12-01 NOTE — Progress Notes (Signed)
CC: Dr. London Pepper, Dr. 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  I first saw the patient at the multidisciplinary breast clinic on 10/19/2014. At the time of a mammogram on 09/20/2014 she was found to have calcifications within the left breast. Additional views on December 15 showed calcifications within the upper-outer quadrant. A biopsy on 10/11/2014 showed DCIS, intermediate grade with necrosis. Her DCIS is ER/PR positive. She attempted breast MR on 10/18/2014, but she became nauseated.On 11/02/2014 she underwent a left partial mastectomy and was found to have both invasive ductal carcinoma and also DCIS, high grade.  Invasive ductal carcinoma was 0.3 cm and DCIS was seen to focally involve the inferior margin and less than 0.1 cm focally from the posterior and medial margins.  3 sentinel lymph nodes were free of metastatic disease.  Her primary tumor was ER/PR positive with a proliferation marker of 17%.  HER-2/neu was not amplified.  Because of her close margins she had a reexcision on 11/17/2014.  There was a microscopic focus of residual invasive ductal carcinoma and DCIS along the new posterior margin and also DCIS along the new inferior margin.  DCIS was 0.1 cm from the new inked inferior margin.  This DCIS measured 0.2 cm in the largest dimension.  She is doing well postoperatively although she does have mild to moderate left breast discomfort.  Physical examination: Alert and oriented. Filed Vitals:   12/01/14 1019  BP: 148/101  Pulse: 80  Temp: 98.3 F (36.8 C)  Resp: 12   Head and neck examination: Grossly unremarkable.  Nodes: Without palpable cervical, supraclavicular, or axillary lymphadenopathy.  Chest: Lungs clear.   Breasts: There is a partial mastectomy wound along the superior aspect of the left breast extending from 11 to 1:00.  The wound is healing well.  There is the usual degree of underlying induration.  No other masses are appreciated.  Right breast without masses or lesions.  Extremities: Without edema.  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  Her left breast DCIS margin is adequate.  We need [facility] to rule out residual microcalcifications/DCIS.  We discussed the potential acute and late toxicities of radiation therapy.  She may be a candidate for hypofractionated radiation therapy, and deep inspiration breath-hold.  She tells me that she is unsure as to whether not she wants to have hypofractionated radiation therapy, and she will check with her son, a Education officer, environmental and [place].  We will schedule her for CT simulation after her left breast mammogram.  30 minutes was spent face-to-face with the patient, primarily counseling the patient and coordinating her care.

## 2014-12-02 ENCOUNTER — Telehealth: Payer: Self-pay | Admitting: *Deleted

## 2014-12-02 NOTE — Telephone Encounter (Signed)
Called patient to inform of mammogram on 12-19-14 - arrival time - 10 am [facility] and her sim on 12-21-14 @ 1 pm @ Dr. Charlton Amor Office, spoke with patient and  She is aware of these appts.

## 2014-12-19 ENCOUNTER — Ambulatory Visit
Admission: RE | Admit: 2014-12-19 | Discharge: 2014-12-19 | Disposition: A | Discharge status: Home / Self Care | Payer: Medicare Other | Source: Ambulatory Visit | Attending: Radiation Oncology | Admitting: Radiation Oncology

## 2014-12-19 ENCOUNTER — Telehealth: Payer: Self-pay | Admitting: Oncology

## 2014-12-19 ENCOUNTER — Ambulatory Visit (HOSPITAL_BASED_OUTPATIENT_CLINIC_OR_DEPARTMENT_OTHER): Payer: Medicare Other | Admitting: Oncology

## 2014-12-19 VITALS — BP 136/85 | HR 80 | Temp 98.0°F | Resp 18 | Ht 66.0 in | Wt 178.9 lb

## 2014-12-19 DIAGNOSIS — C50412 Malignant neoplasm of upper-outer quadrant of left female breast: Secondary | ICD-10-CM

## 2014-12-19 DIAGNOSIS — Z170 Estrogen receptor positive status [ER+]: Secondary | ICD-10-CM

## 2014-12-19 DIAGNOSIS — N6489 Other specified disorders of breast: Secondary | ICD-10-CM | POA: Diagnosis not present

## 2014-12-19 DIAGNOSIS — D0512 Intraductal carcinoma in situ of left breast: Secondary | ICD-10-CM | POA: Diagnosis not present

## 2014-12-19 DIAGNOSIS — Z853 Personal history of malignant neoplasm of breast: Secondary | ICD-10-CM | POA: Diagnosis not present

## 2014-12-19 NOTE — Progress Notes (Signed)
[place]  [phone] [phone]     ID: FREDRICKA KOHRS DOB: [birth year]  MR#: [id number]  QQI#:[id number]  Patient Care Team: London Pepper, MD as PCP - General (Family Medicine) Stark Klein, MD as Consulting Physician (General Surgery) Arloa Koh, MD as Consulting Physician (Radiation Oncology) Chauncey Cruel, MD as Consulting Physician (Oncology) Holley Bouche, NP as Nurse Practitioner (Nurse Practitioner) Rockwell Germany, RN as Registered Nurse Mauro Kaufmann, RN as Registered Nurse OTHER MD:  CHIEF COMPLAINT: Ductal carcinoma in situ  CURRENT TREATMENT: Awaiting adjuvant radiation   BREAST CANCER HISTORY: From the original intake note:  Ms. Thier had routine screening mammography at the breast Center 09/20/2014 going showing her breast density to be category B. In the left breast new calcifications were noted, and these were further evaluated with left diagnostic mammography 09/27/2014. The calcifications when the upper outer quadrant, and were mildly irregular and linear. Biopsy was performed 10/11/2014 and showed ([id number]) ductal carcinoma in situ, grade 2, estrogen receptor 100% positive, progesterone receptor 30% positive, both with strong staining intensity.  MRI was scheduled but the patient was unable to tolerate it.  Her subsequent history is as detailed below  INTERVAL HISTORY: Harlene returns today for follow-up of her early stage breast cancer, accompanied by a friend. Since her last visit here she underwent left lumpectomy and sentinel lymph node sampling 11/02/2014. The final pathology ([id number]) showed ductal carcinoma in situ with 1 area of microinvasion measuring 3 mm. A total of 3 sentinel lymph nodes were clear. Margins were positive, 3 mm for the invasive component but focally involving the inferior margin for DCIS. Estrogen receptor was 100%, estrogen receptor 76%, both with strong staining positivity, and the  proliferation marker was 17%. HER-2 was negative, with a signals ratio of 1.37 and the number per cell 1.85.  On 11/17/2014 the patient underwent further excision of the posterior, medial and inferior margins. There was additional carcinoma in situ but no further invasive component. Negative margins were attained, but remain within a millimeter of the current margin.  The patient's case was presented at the multidisciplinary breast cancer conference 11/25/2014. At that point the consensus was no further reexcision needed to be performed but the patient should receive radiation, followed by anti-estrogens. This is in line with the recent large retrospective review of DCIS by Dr. Orland Mustard and others which showed as a long as there is "no ink on margin" AND the patient receives radiation, there is no increase risk of DCIS recurrence as compared to repeat lumpectomy to improve margins.  REVIEW OF SYSTEMS: She tolerated both surgeries well, though she did develop a seroma with the second procedure. That seems to be receding. There was no significant pain, erythema, or fever. She is not yet exercising regularly but is beginning to take walks. A detailed review of systems today was otherwise stable.  PAST MEDICAL HISTORY: Past Medical History  Diagnosis Date  . Hypertension     hx  . Wears glasses   . Wears partial dentures   . Breast cancer, left breast 11/02/14    Upper, Outer Quadrant    PAST SURGICAL HISTORY: Past Surgical History  Procedure Laterality Date  . Abdominal hysterectomy    . Colonoscopy    . Radioactive seed guided mastectomy with axillary sentinel lymph node biopsy Left 11/02/2014    Procedure: RADIOACTIVE SEED GUIDED PARTIAL MASTECTOMY WITH AXILLARY SENTINEL LYMPH NODE BIOPSY;  Surgeon: Stark Klein, MD;  Location: [facility];  Service: General;  Laterality: Left;  . Re-excision of breast lumpectomy Left 11/17/2014    Procedure: RE-EXCISION OF LEFT BREAST LUMPECTOMY;   Surgeon: Stark Klein, MD;  Location: [facility];  Service: General;  Laterality: Left;    FAMILY HISTORY No family history on file. The patient's father died in an automobile accident at age 42. The patient's mother died at age 71. The patient had 2 brothers, 6 sisters. There is no history of breast or ovarian cancer in the family.  GYNECOLOGIC HISTORY:  No LMP recorded. Patient is postmenopausal. Menarche age 68. First live birth age 41. The patient is GX P3. She status post total abdominal hysterectomy with bilateral salpingo-oophorectomy. She did not take hormone replacement. 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 Her son [name] works as a Psychologist, sport and exercise in [place]. Her son [name] is Quarry manager. of [employer] in [place]. The patient has 3 grandchildren. She attends [employer]    ADVANCED DIRECTIVES: Not in place   HEALTH MAINTENANCE: History  Substance Use Topics  . Smoking status: Never Smoker   . Smokeless tobacco: Never Used  . Alcohol Use: No     Colonoscopy: 2015/[facility]  PAP: Status post hysterectomy  Bone density:  Lipid panel:  Allergies  Allergen Reactions  . Amoxicillin     Does not know  . Contrast Media [Iodinated Diagnostic Agents] Nausea And Vomiting    Current Outpatient Prescriptions  Medication Sig Dispense Refill  . calcium carbonate (OS-CAL) 600 MG TABS tablet Take 600 mg by mouth 2 (two) times daily with a meal.    . Glucosamine-Chondroit-Vit C-Mn (GLUCOSAMINE CHONDR 1500 COMPLX PO) Take by mouth.    . hydrochlorothiazide (HYDRODIURIL) 25 MG tablet Take 25 mg by mouth daily.    Marland Kitchen omega-3 acid ethyl esters (LOVAZA) 1 G capsule Take 1 g by mouth daily.    Marland Kitchen oxyCODONE-acetaminophen (ROXICET) 5-325 MG per tablet Take 1-2 tablets by mouth every 4 (four) hours as needed for severe pain. 30  tablet 0   No current facility-administered medications for this visit.    OBJECTIVE: Older African-American woman in no acute distress Filed Vitals:   12/19/14 1321  BP: 136/85  Pulse: 80  Temp: 98 F (36.7 C)  Resp: 18     Body mass index is 28.89 kg/(m^2).    ECOG FS:0 - Asymptomatic  Sclerae unicteric, pupils round and equal Oropharynx clear and moist No cervical or supraclavicular adenopathy Lungs no rales or rhonchi Heart regular rate and rhythm Abd soft, nontender, positive bowel sounds MSK no focal spinal tenderness, no upper extremity lymphedema Neuro: nonfocal, well oriented, positive affect Breasts: The right breast is unremarkable. The left breast is status post recent surgery. The incisions are healing nicely, with no dehiscence, erythema, or swelling. There is some induration beneath the scar, but no fluctuance. The left axilla is benign   LAB RESULTS:  CMP     Component Value Date/Time   NA 139 10/19/2014 1155   K 3.5 10/19/2014 1155   CO2 27 10/19/2014 1155   GLUCOSE 101 10/19/2014 1155   BUN 14.5 10/19/2014 1155   CREATININE 0.9 10/19/2014 1155   CALCIUM 9.5 10/19/2014 1155   PROT 7.1 10/19/2014 1155   ALBUMIN 4.0 10/19/2014 1155   AST 23 10/19/2014 1155   ALT 11 10/19/2014 1155   ALKPHOS 62  10/19/2014 1155   BILITOT 0.89 10/19/2014 1155    INo results found for: SPEP, UPEP  Lab Results  Component Value Date   WBC 6.5 10/19/2014   NEUTROABS 4.0 10/19/2014   HGB 14.1 10/19/2014   HCT 43.4 10/19/2014   MCV 92.8 10/19/2014   PLT 219 10/19/2014      Chemistry      Component Value Date/Time   NA 139 10/19/2014 1155   K 3.5 10/19/2014 1155   CO2 27 10/19/2014 1155   BUN 14.5 10/19/2014 1155   CREATININE 0.9 10/19/2014 1155      Component Value Date/Time   CALCIUM 9.5 10/19/2014 1155   ALKPHOS 62 10/19/2014 1155   AST 23 10/19/2014 1155   ALT 11 10/19/2014 1155   BILITOT 0.89 10/19/2014 1155       No results found for:  LABCA2  No components found for: LABCA125  No results for input(s): INR in the last 168 hours.  Urinalysis No results found for: COLORURINE, APPEARANCEUR, LABSPEC, [place], GLUCOSEU, HGBUR, BILIRUBINUR, KETONESUR, PROTEINUR, UROBILINOGEN, NITRITE, LEUKOCYTESUR  STUDIES: Mm Digital Diagnostic Unilat L  12/19/2014   CLINICAL DATA:  Status post left lumpectomy for ductal carcinoma in situ with re-excision for positive margins. Pre radiation therapy evaluation.  EXAM: DIGITAL DIAGNOSTIC LEFT MAMMOGRAM WITH CAD  COMPARISON:  Previous examinations.  ACR Breast Density Category b: There are scattered areas of fibroglandular density.  FINDINGS: Interval post lumpectomy changes on the left with a new oval density in the central aspect of the breast, centered slightly superiorly, measuring 6 x 6 x 6 cm in maximum dimensions. Interval diffuse anterior and inferior skin thickening. This is more pronounced at the lumpectomy site, with indentation of the skin at that location. No residual microcalcifications are seen.  Mammographic images were processed with CAD.  IMPRESSION: Interval post lumpectomy changes on the left with a 6 cm postsurgical seroma in the lumpectomy bed. No residual calcifications or new findings suspicious for malignancy.  RECOMMENDATION: 1. Treatment plan. 2. Bilateral diagnostic mammogram in 9 months. That will be 1 year since mammographic evaluation of the right breast.  I have discussed the findings and recommendations with the patient. Results were also provided in writing at the conclusion of the visit. If applicable, a reminder letter will be sent to the patient regarding the next appointment.  BI-RADS CATEGORY  2: Benign.   Electronically Signed   By: Claudie Revering M.D.   On: 12/19/2014 11:09    ASSESSMENT: 74 y.o. [place] woman status post left breast upper outer quadrant biopsy 10/11/2014 showing ductal carcinoma in situ, grade 2, estrogen and progesterone receptor positive  (1)  status post left lumpectomy and sentinel lymph node sampling for a pT1a pN0, stage IA invasive ductal carcinoma, grade 2, estrogen receptor 100% positive, progesterone receptor 76% positive, with an MIB-1 of 17%, and no HER-2 amplification. Margins were focally involved by ductal carcinoma in situ  (a) additional surgery to 02/01/2015 showed DCIS still close but now negative margins   (2) radiation oncology to follow surgery  (3) antiestrogen therapy to follow radiation  PLAN: I reviewed the overall situation with the patient and her friend. They understand we did discover some invasive tumor hiding in the noninvasive component. The invasive portion was only 3 mm. We never used chemotherapy for these very small tumors and in fact even anti-estrogens in general in these cases is optional.  I don't think the radiation is optional, because of the very close margins. We discussed  the fact that margin which does seem to correlate well with the risk of recurrence except in patients who have negative margins and receive radiation. In that case the correlation is poor to 9. In other words I feel very comfortable with her current margins so long as she receives radiation, which is what is planned.  Once she completes the radiation she will return to see me. We will discuss anti-estrogens at that time. That will further reduce her risk of recurrence but more importantly it will also reduce her risk of developing a new breast cancer in either breast.  I have encouraged her to start an exercise program and gave her some information on [employer].  Sameeha has a good understanding of the overall plan. She agrees with it. She knows the goal of treatment in her case is cure. She will call with any problems that may develop before her next visit here.  Chauncey Cruel, MD   12/19/2014 1:31 PM Medical Oncology and Hematology [facility] [address] [place], [place] [address] Tel. [phone]     Fax. [phone]

## 2014-12-19 NOTE — Telephone Encounter (Signed)
GAVE PT AVS REPORT AND APPTS FOR MAY 2016

## 2014-12-21 ENCOUNTER — Ambulatory Visit
Admission: RE | Admit: 2014-12-21 | Discharge: 2014-12-21 | Disposition: A | Discharge status: Home / Self Care | Payer: Medicare Other | Source: Ambulatory Visit | Attending: Radiation Oncology | Admitting: Radiation Oncology

## 2014-12-21 DIAGNOSIS — L599 Disorder of the skin and subcutaneous tissue related to radiation, unspecified: Secondary | ICD-10-CM | POA: Diagnosis not present

## 2014-12-21 DIAGNOSIS — L814 Other melanin hyperpigmentation: Secondary | ICD-10-CM | POA: Diagnosis not present

## 2014-12-21 DIAGNOSIS — C50412 Malignant neoplasm of upper-outer quadrant of left female breast: Secondary | ICD-10-CM | POA: Diagnosis not present

## 2014-12-21 DIAGNOSIS — Z510 Encounter for antineoplastic radiation therapy: Secondary | ICD-10-CM | POA: Diagnosis not present

## 2014-12-21 DIAGNOSIS — D0512 Intraductal carcinoma in situ of left breast: Secondary | ICD-10-CM | POA: Diagnosis not present

## 2014-12-21 DIAGNOSIS — Z9012 Acquired absence of left breast and nipple: Secondary | ICD-10-CM | POA: Diagnosis not present

## 2014-12-21 DIAGNOSIS — Z170 Estrogen receptor positive status [ER+]: Secondary | ICD-10-CM | POA: Diagnosis not present

## 2014-12-21 NOTE — Progress Notes (Signed)
Complex simulation/treatment planning note: The patient was taken to the CT simulator.  A custom Vac lock device was constructed on a custom breast board for immobilization.  Her left breast was marked with radiopaque wires along with her partial mastectomy scar.  She was scanned free breathing.  The cardiac silhouette was near the chest wall and she was rescanned with deep inspiration breath-hold.  There was favorable movement of the cardiac silhouette away from the chest wall/breast.  The CT data set was sent to the planning system where normal anatomy and also target structures including her lumpectomy bed were contoured.  She was set up to medial and lateral left breast tangents.  2 sets of multileaf collimators were designed to conform the field.  I am prescribing 4600 cGy in 23 sessions utilizing mixed energy photons.  This be followed by a photon boost to her tumor bed for a further 1400 cGy in 7 sessions.  We may rescan her breast to see if her seroma decreases during her whole breast radiation to hopefully decrease the size of her boost volume.

## 2014-12-22 ENCOUNTER — Encounter: Payer: Self-pay | Admitting: Radiation Oncology

## 2014-12-27 ENCOUNTER — Encounter: Payer: Self-pay | Admitting: Radiation Oncology

## 2014-12-27 DIAGNOSIS — Z170 Estrogen receptor positive status [ER+]: Secondary | ICD-10-CM | POA: Diagnosis not present

## 2014-12-27 DIAGNOSIS — Z510 Encounter for antineoplastic radiation therapy: Secondary | ICD-10-CM | POA: Diagnosis not present

## 2014-12-27 DIAGNOSIS — C50412 Malignant neoplasm of upper-outer quadrant of left female breast: Secondary | ICD-10-CM | POA: Diagnosis not present

## 2014-12-27 DIAGNOSIS — Z9012 Acquired absence of left breast and nipple: Secondary | ICD-10-CM | POA: Diagnosis not present

## 2014-12-27 DIAGNOSIS — L814 Other melanin hyperpigmentation: Secondary | ICD-10-CM | POA: Diagnosis not present

## 2014-12-27 DIAGNOSIS — L599 Disorder of the skin and subcutaneous tissue related to radiation, unspecified: Secondary | ICD-10-CM | POA: Diagnosis not present

## 2014-12-27 DIAGNOSIS — D0512 Intraductal carcinoma in situ of left breast: Secondary | ICD-10-CM | POA: Diagnosis not present

## 2014-12-27 NOTE — Progress Notes (Signed)
3-D simulation note: The patient completed 3-D simulation for treatment to her left breast.  She was set up to medial and lateral left breast tangents with deep inspiration breath-hold.  2 unique sets of multileaf collimators along with 2 electronic compensation fields are utilized for a total four complex treatment devices.  Dose volume histograms were obtained for the target structures including her seroma/tumor bed and avoidance structures including the lungs and heart.  We met our departmental guidelines.  I'm prescribing 4600 cGy in 23 sessions utilizing 6 MV photons.

## 2014-12-28 ENCOUNTER — Ambulatory Visit: Payer: Medicare Other | Admitting: Radiation Oncology

## 2014-12-28 ENCOUNTER — Ambulatory Visit
Admission: RE | Admit: 2014-12-28 | Discharge: 2014-12-28 | Disposition: A | Discharge status: Home / Self Care | Payer: Medicare Other | Source: Ambulatory Visit | Attending: Radiation Oncology | Admitting: Radiation Oncology

## 2014-12-28 DIAGNOSIS — L814 Other melanin hyperpigmentation: Secondary | ICD-10-CM | POA: Diagnosis not present

## 2014-12-28 DIAGNOSIS — Z510 Encounter for antineoplastic radiation therapy: Secondary | ICD-10-CM | POA: Diagnosis not present

## 2014-12-28 DIAGNOSIS — D0512 Intraductal carcinoma in situ of left breast: Secondary | ICD-10-CM | POA: Diagnosis not present

## 2014-12-28 DIAGNOSIS — L599 Disorder of the skin and subcutaneous tissue related to radiation, unspecified: Secondary | ICD-10-CM | POA: Diagnosis not present

## 2014-12-28 DIAGNOSIS — Z170 Estrogen receptor positive status [ER+]: Secondary | ICD-10-CM | POA: Diagnosis not present

## 2014-12-28 DIAGNOSIS — C50412 Malignant neoplasm of upper-outer quadrant of left female breast: Secondary | ICD-10-CM | POA: Diagnosis not present

## 2014-12-28 DIAGNOSIS — Z9012 Acquired absence of left breast and nipple: Secondary | ICD-10-CM | POA: Diagnosis not present

## 2014-12-29 ENCOUNTER — Ambulatory Visit
Admission: RE | Admit: 2014-12-29 | Discharge: 2014-12-29 | Disposition: A | Discharge status: Home / Self Care | Payer: Medicare Other | Source: Ambulatory Visit | Attending: Radiation Oncology | Admitting: Radiation Oncology

## 2014-12-29 DIAGNOSIS — Z170 Estrogen receptor positive status [ER+]: Secondary | ICD-10-CM | POA: Diagnosis not present

## 2014-12-29 DIAGNOSIS — D0512 Intraductal carcinoma in situ of left breast: Secondary | ICD-10-CM | POA: Diagnosis not present

## 2014-12-29 DIAGNOSIS — C50412 Malignant neoplasm of upper-outer quadrant of left female breast: Secondary | ICD-10-CM | POA: Diagnosis not present

## 2014-12-29 DIAGNOSIS — L814 Other melanin hyperpigmentation: Secondary | ICD-10-CM | POA: Diagnosis not present

## 2014-12-29 DIAGNOSIS — Z510 Encounter for antineoplastic radiation therapy: Secondary | ICD-10-CM | POA: Diagnosis not present

## 2014-12-29 DIAGNOSIS — L599 Disorder of the skin and subcutaneous tissue related to radiation, unspecified: Secondary | ICD-10-CM | POA: Diagnosis not present

## 2014-12-29 DIAGNOSIS — Z9012 Acquired absence of left breast and nipple: Secondary | ICD-10-CM | POA: Diagnosis not present

## 2014-12-30 ENCOUNTER — Encounter: Payer: Self-pay | Admitting: *Deleted

## 2014-12-30 ENCOUNTER — Ambulatory Visit
Admission: RE | Admit: 2014-12-30 | Discharge: 2014-12-30 | Disposition: A | Discharge status: Home / Self Care | Payer: Medicare Other | Source: Ambulatory Visit | Attending: Radiation Oncology | Admitting: Radiation Oncology

## 2014-12-30 DIAGNOSIS — D0512 Intraductal carcinoma in situ of left breast: Secondary | ICD-10-CM | POA: Diagnosis not present

## 2014-12-30 DIAGNOSIS — C50412 Malignant neoplasm of upper-outer quadrant of left female breast: Secondary | ICD-10-CM | POA: Diagnosis not present

## 2014-12-30 DIAGNOSIS — Z9012 Acquired absence of left breast and nipple: Secondary | ICD-10-CM | POA: Diagnosis not present

## 2014-12-30 DIAGNOSIS — Z510 Encounter for antineoplastic radiation therapy: Secondary | ICD-10-CM | POA: Diagnosis not present

## 2014-12-30 DIAGNOSIS — L599 Disorder of the skin and subcutaneous tissue related to radiation, unspecified: Secondary | ICD-10-CM | POA: Diagnosis not present

## 2014-12-30 DIAGNOSIS — L814 Other melanin hyperpigmentation: Secondary | ICD-10-CM | POA: Diagnosis not present

## 2014-12-30 DIAGNOSIS — Z170 Estrogen receptor positive status [ER+]: Secondary | ICD-10-CM | POA: Diagnosis not present

## 2014-12-30 NOTE — Progress Notes (Signed)
Met with pt after second xrt treatment. Pt relate she is doing well. Would like to attend ABC class. Order placed. Encourage pt to call with needs. Received verbal understanding.

## 2015-01-02 ENCOUNTER — Ambulatory Visit
Admission: RE | Admit: 2015-01-02 | Discharge: 2015-01-02 | Disposition: A | Discharge status: Home / Self Care | Payer: Medicare Other | Source: Ambulatory Visit | Attending: Radiation Oncology | Admitting: Radiation Oncology

## 2015-01-02 VITALS — BP 141/95 | HR 76 | Temp 97.6°F | Wt 175.2 lb

## 2015-01-02 DIAGNOSIS — C50412 Malignant neoplasm of upper-outer quadrant of left female breast: Secondary | ICD-10-CM | POA: Diagnosis not present

## 2015-01-02 DIAGNOSIS — Z9012 Acquired absence of left breast and nipple: Secondary | ICD-10-CM | POA: Diagnosis not present

## 2015-01-02 DIAGNOSIS — Z170 Estrogen receptor positive status [ER+]: Secondary | ICD-10-CM | POA: Diagnosis not present

## 2015-01-02 DIAGNOSIS — L814 Other melanin hyperpigmentation: Secondary | ICD-10-CM | POA: Diagnosis not present

## 2015-01-02 DIAGNOSIS — Z510 Encounter for antineoplastic radiation therapy: Secondary | ICD-10-CM | POA: Insufficient documentation

## 2015-01-02 DIAGNOSIS — D0512 Intraductal carcinoma in situ of left breast: Secondary | ICD-10-CM | POA: Diagnosis not present

## 2015-01-02 DIAGNOSIS — L599 Disorder of the skin and subcutaneous tissue related to radiation, unspecified: Secondary | ICD-10-CM | POA: Diagnosis not present

## 2015-01-02 MED ORDER — ALRA NON-METALLIC DEODORANT (RAD-ONC)
1.0000 "application " | Freq: Once | TOPICAL | Status: AC
  Administered 2015-01-02: 1 via TOPICAL

## 2015-01-02 MED ORDER — RADIAPLEXRX EX GEL
Freq: Once | CUTANEOUS | Status: AC
  Administered 2015-01-02: 13:00:00 via TOPICAL

## 2015-01-02 NOTE — Progress Notes (Signed)
Weekly Management Note:  Site: Left breast Current Dose:  600  cGy Projected Dose: 4600  cGy followed by 7 fraction boost  Narrative: The patient is seen today for routine under treatment assessment. CBCT/MVCT images/port films were reviewed. The chart was reviewed.   She is without complaints today.  She had her patient orientation/teaching today.  Physical Examination:  Filed Vitals:   01/02/15 1305  BP: 141/95  Pulse: 76  Temp: 97.6 F (36.4 C)  .  Weight: 175 lb 3.2 oz (79.47 kg).  There are no significant skin changes.  Impression: Tolerating radiation therapy well.  Plan: Continue radiation therapy as planned.

## 2015-01-02 NOTE — Progress Notes (Signed)
Routine of clinic reviewed with patient. Pt here for patient teaching.  Radiation and You booklet given with areas of pertinence flagged and highlighted.  Reviewed fatigue, hair loss, skin changes, breast tenderness and breast swelling . Pt able to give teach back of to pat skin,apply Radiaplex bid, avoid applying anything to skin within 4 hours of treatment and to use an electric razor if they must shave. Pt demonstrated understanding of information given and will contact nursing with any questions or concerns.

## 2015-01-03 ENCOUNTER — Ambulatory Visit: Payer: Medicare Other | Admitting: Physical Therapy

## 2015-01-03 ENCOUNTER — Ambulatory Visit
Admission: RE | Admit: 2015-01-03 | Discharge: 2015-01-03 | Disposition: A | Discharge status: Home / Self Care | Payer: Medicare Other | Source: Ambulatory Visit | Attending: Radiation Oncology | Admitting: Radiation Oncology

## 2015-01-03 DIAGNOSIS — L599 Disorder of the skin and subcutaneous tissue related to radiation, unspecified: Secondary | ICD-10-CM | POA: Diagnosis not present

## 2015-01-03 DIAGNOSIS — Z510 Encounter for antineoplastic radiation therapy: Secondary | ICD-10-CM | POA: Diagnosis not present

## 2015-01-03 DIAGNOSIS — D0512 Intraductal carcinoma in situ of left breast: Secondary | ICD-10-CM | POA: Diagnosis not present

## 2015-01-03 DIAGNOSIS — Z170 Estrogen receptor positive status [ER+]: Secondary | ICD-10-CM | POA: Diagnosis not present

## 2015-01-03 DIAGNOSIS — C50412 Malignant neoplasm of upper-outer quadrant of left female breast: Secondary | ICD-10-CM | POA: Diagnosis not present

## 2015-01-03 DIAGNOSIS — Z9012 Acquired absence of left breast and nipple: Secondary | ICD-10-CM | POA: Diagnosis not present

## 2015-01-03 DIAGNOSIS — L814 Other melanin hyperpigmentation: Secondary | ICD-10-CM | POA: Diagnosis not present

## 2015-01-04 ENCOUNTER — Ambulatory Visit
Admission: RE | Admit: 2015-01-04 | Discharge: 2015-01-04 | Disposition: A | Discharge status: Home / Self Care | Payer: Medicare Other | Source: Ambulatory Visit | Attending: Radiation Oncology | Admitting: Radiation Oncology

## 2015-01-04 DIAGNOSIS — C50412 Malignant neoplasm of upper-outer quadrant of left female breast: Secondary | ICD-10-CM | POA: Diagnosis not present

## 2015-01-04 DIAGNOSIS — Z170 Estrogen receptor positive status [ER+]: Secondary | ICD-10-CM | POA: Diagnosis not present

## 2015-01-04 DIAGNOSIS — Z510 Encounter for antineoplastic radiation therapy: Secondary | ICD-10-CM | POA: Diagnosis not present

## 2015-01-04 DIAGNOSIS — D0512 Intraductal carcinoma in situ of left breast: Secondary | ICD-10-CM | POA: Diagnosis not present

## 2015-01-04 DIAGNOSIS — Z9012 Acquired absence of left breast and nipple: Secondary | ICD-10-CM | POA: Diagnosis not present

## 2015-01-04 DIAGNOSIS — L599 Disorder of the skin and subcutaneous tissue related to radiation, unspecified: Secondary | ICD-10-CM | POA: Diagnosis not present

## 2015-01-04 DIAGNOSIS — L814 Other melanin hyperpigmentation: Secondary | ICD-10-CM | POA: Diagnosis not present

## 2015-01-05 ENCOUNTER — Ambulatory Visit
Admission: RE | Admit: 2015-01-05 | Discharge: 2015-01-05 | Disposition: A | Discharge status: Home / Self Care | Payer: Medicare Other | Source: Ambulatory Visit | Attending: Radiation Oncology | Admitting: Radiation Oncology

## 2015-01-05 DIAGNOSIS — L814 Other melanin hyperpigmentation: Secondary | ICD-10-CM | POA: Diagnosis not present

## 2015-01-05 DIAGNOSIS — D0512 Intraductal carcinoma in situ of left breast: Secondary | ICD-10-CM | POA: Diagnosis not present

## 2015-01-05 DIAGNOSIS — Z170 Estrogen receptor positive status [ER+]: Secondary | ICD-10-CM | POA: Diagnosis not present

## 2015-01-05 DIAGNOSIS — Z9012 Acquired absence of left breast and nipple: Secondary | ICD-10-CM | POA: Diagnosis not present

## 2015-01-05 DIAGNOSIS — Z510 Encounter for antineoplastic radiation therapy: Secondary | ICD-10-CM | POA: Diagnosis not present

## 2015-01-05 DIAGNOSIS — C50412 Malignant neoplasm of upper-outer quadrant of left female breast: Secondary | ICD-10-CM | POA: Diagnosis not present

## 2015-01-05 DIAGNOSIS — L599 Disorder of the skin and subcutaneous tissue related to radiation, unspecified: Secondary | ICD-10-CM | POA: Diagnosis not present

## 2015-01-06 ENCOUNTER — Ambulatory Visit
Admission: RE | Admit: 2015-01-06 | Discharge: 2015-01-06 | Disposition: A | Discharge status: Home / Self Care | Payer: Medicare Other | Source: Ambulatory Visit | Attending: Radiation Oncology | Admitting: Radiation Oncology

## 2015-01-06 DIAGNOSIS — Z510 Encounter for antineoplastic radiation therapy: Secondary | ICD-10-CM | POA: Diagnosis not present

## 2015-01-06 DIAGNOSIS — C50412 Malignant neoplasm of upper-outer quadrant of left female breast: Secondary | ICD-10-CM | POA: Diagnosis not present

## 2015-01-06 DIAGNOSIS — Z170 Estrogen receptor positive status [ER+]: Secondary | ICD-10-CM | POA: Diagnosis not present

## 2015-01-06 DIAGNOSIS — L814 Other melanin hyperpigmentation: Secondary | ICD-10-CM | POA: Diagnosis not present

## 2015-01-06 DIAGNOSIS — Z9012 Acquired absence of left breast and nipple: Secondary | ICD-10-CM | POA: Diagnosis not present

## 2015-01-06 DIAGNOSIS — L599 Disorder of the skin and subcutaneous tissue related to radiation, unspecified: Secondary | ICD-10-CM | POA: Diagnosis not present

## 2015-01-06 DIAGNOSIS — D0512 Intraductal carcinoma in situ of left breast: Secondary | ICD-10-CM | POA: Diagnosis not present

## 2015-01-09 ENCOUNTER — Ambulatory Visit
Admission: RE | Admit: 2015-01-09 | Discharge: 2015-01-09 | Disposition: A | Discharge status: Home / Self Care | Payer: Medicare Other | Source: Ambulatory Visit | Attending: Radiation Oncology | Admitting: Radiation Oncology

## 2015-01-09 VITALS — BP 121/82 | HR 75 | Temp 97.6°F | Wt 177.6 lb

## 2015-01-09 DIAGNOSIS — L599 Disorder of the skin and subcutaneous tissue related to radiation, unspecified: Secondary | ICD-10-CM | POA: Diagnosis not present

## 2015-01-09 DIAGNOSIS — L814 Other melanin hyperpigmentation: Secondary | ICD-10-CM | POA: Diagnosis not present

## 2015-01-09 DIAGNOSIS — Z9012 Acquired absence of left breast and nipple: Secondary | ICD-10-CM | POA: Diagnosis not present

## 2015-01-09 DIAGNOSIS — C50412 Malignant neoplasm of upper-outer quadrant of left female breast: Secondary | ICD-10-CM

## 2015-01-09 DIAGNOSIS — Z170 Estrogen receptor positive status [ER+]: Secondary | ICD-10-CM | POA: Diagnosis not present

## 2015-01-09 DIAGNOSIS — D0512 Intraductal carcinoma in situ of left breast: Secondary | ICD-10-CM | POA: Diagnosis not present

## 2015-01-09 DIAGNOSIS — Z510 Encounter for antineoplastic radiation therapy: Secondary | ICD-10-CM | POA: Diagnosis not present

## 2015-01-09 NOTE — Progress Notes (Signed)
Weekly assessment of radiation to left OrthoTraffic.ch 8 of 30 treatments.Denies pain.Mild tanning of skin.Continue application of radiaplex twice daily.Patient has been using aloe vera plant as well.Mild fatigue.

## 2015-01-09 NOTE — Progress Notes (Signed)
Weekly Management Note:  Site: Left breast Current Dose: 1600  cGy Projected Dose: 4600  cGy followed by 7 fraction boost  Narrative: The patient is seen today for routine under treatment assessment. CBCT/MVCT images/port films were reviewed. The chart was reviewed.   She is without complaints today except fatigue.    Physical Examination:  Filed Vitals:   01/09/15 1412  BP: 121/82  Pulse: 75  Temp: 97.6 F (36.4 C)  .  Weight: 177 lb 9.6 oz (80.559 kg).  Mild .hyperpigmentation of left breast  Impression: Tolerating radiation therapy well.  Plan: Continue radiation therapy as planned.  -----------------------------------  Eppie Gibson, MD

## 2015-01-10 ENCOUNTER — Ambulatory Visit
Admission: RE | Admit: 2015-01-10 | Discharge: 2015-01-10 | Disposition: A | Discharge status: Home / Self Care | Payer: Medicare Other | Source: Ambulatory Visit | Attending: Radiation Oncology | Admitting: Radiation Oncology

## 2015-01-10 DIAGNOSIS — L599 Disorder of the skin and subcutaneous tissue related to radiation, unspecified: Secondary | ICD-10-CM | POA: Diagnosis not present

## 2015-01-10 DIAGNOSIS — Z510 Encounter for antineoplastic radiation therapy: Secondary | ICD-10-CM | POA: Diagnosis not present

## 2015-01-10 DIAGNOSIS — Z170 Estrogen receptor positive status [ER+]: Secondary | ICD-10-CM | POA: Diagnosis not present

## 2015-01-10 DIAGNOSIS — C50412 Malignant neoplasm of upper-outer quadrant of left female breast: Secondary | ICD-10-CM | POA: Diagnosis not present

## 2015-01-10 DIAGNOSIS — L814 Other melanin hyperpigmentation: Secondary | ICD-10-CM | POA: Diagnosis not present

## 2015-01-10 DIAGNOSIS — D0512 Intraductal carcinoma in situ of left breast: Secondary | ICD-10-CM | POA: Diagnosis not present

## 2015-01-10 DIAGNOSIS — Z9012 Acquired absence of left breast and nipple: Secondary | ICD-10-CM | POA: Diagnosis not present

## 2015-01-11 ENCOUNTER — Ambulatory Visit
Admission: RE | Admit: 2015-01-11 | Discharge: 2015-01-11 | Disposition: A | Discharge status: Home / Self Care | Payer: Medicare Other | Source: Ambulatory Visit | Attending: Radiation Oncology | Admitting: Radiation Oncology

## 2015-01-11 DIAGNOSIS — Z9012 Acquired absence of left breast and nipple: Secondary | ICD-10-CM | POA: Diagnosis not present

## 2015-01-11 DIAGNOSIS — Z170 Estrogen receptor positive status [ER+]: Secondary | ICD-10-CM | POA: Diagnosis not present

## 2015-01-11 DIAGNOSIS — Z510 Encounter for antineoplastic radiation therapy: Secondary | ICD-10-CM | POA: Diagnosis not present

## 2015-01-11 DIAGNOSIS — L814 Other melanin hyperpigmentation: Secondary | ICD-10-CM | POA: Diagnosis not present

## 2015-01-11 DIAGNOSIS — L599 Disorder of the skin and subcutaneous tissue related to radiation, unspecified: Secondary | ICD-10-CM | POA: Diagnosis not present

## 2015-01-11 DIAGNOSIS — C50412 Malignant neoplasm of upper-outer quadrant of left female breast: Secondary | ICD-10-CM | POA: Diagnosis not present

## 2015-01-11 DIAGNOSIS — D0512 Intraductal carcinoma in situ of left breast: Secondary | ICD-10-CM | POA: Diagnosis not present

## 2015-01-12 ENCOUNTER — Ambulatory Visit
Admission: RE | Admit: 2015-01-12 | Discharge: 2015-01-12 | Disposition: A | Discharge status: Home / Self Care | Payer: Medicare Other | Source: Ambulatory Visit | Attending: Radiation Oncology | Admitting: Radiation Oncology

## 2015-01-12 DIAGNOSIS — L814 Other melanin hyperpigmentation: Secondary | ICD-10-CM | POA: Diagnosis not present

## 2015-01-12 DIAGNOSIS — C50412 Malignant neoplasm of upper-outer quadrant of left female breast: Secondary | ICD-10-CM | POA: Diagnosis not present

## 2015-01-12 DIAGNOSIS — Z510 Encounter for antineoplastic radiation therapy: Secondary | ICD-10-CM | POA: Diagnosis not present

## 2015-01-12 DIAGNOSIS — Z9012 Acquired absence of left breast and nipple: Secondary | ICD-10-CM | POA: Diagnosis not present

## 2015-01-12 DIAGNOSIS — Z170 Estrogen receptor positive status [ER+]: Secondary | ICD-10-CM | POA: Diagnosis not present

## 2015-01-12 DIAGNOSIS — L599 Disorder of the skin and subcutaneous tissue related to radiation, unspecified: Secondary | ICD-10-CM | POA: Diagnosis not present

## 2015-01-12 DIAGNOSIS — D0512 Intraductal carcinoma in situ of left breast: Secondary | ICD-10-CM | POA: Diagnosis not present

## 2015-01-13 ENCOUNTER — Ambulatory Visit: Payer: Medicare Other

## 2015-01-13 ENCOUNTER — Ambulatory Visit
Admission: RE | Admit: 2015-01-13 | Discharge: 2015-01-13 | Disposition: A | Discharge status: Home / Self Care | Payer: Medicare Other | Source: Ambulatory Visit | Attending: Radiation Oncology | Admitting: Radiation Oncology

## 2015-01-16 ENCOUNTER — Ambulatory Visit
Admission: RE | Admit: 2015-01-16 | Discharge: 2015-01-16 | Disposition: A | Discharge status: Home / Self Care | Payer: Medicare Other | Source: Ambulatory Visit | Attending: Radiation Oncology | Admitting: Radiation Oncology

## 2015-01-16 VITALS — BP 122/83 | HR 75 | Temp 98.3°F | Wt 178.0 lb

## 2015-01-16 DIAGNOSIS — L814 Other melanin hyperpigmentation: Secondary | ICD-10-CM | POA: Diagnosis not present

## 2015-01-16 DIAGNOSIS — D0512 Intraductal carcinoma in situ of left breast: Secondary | ICD-10-CM | POA: Diagnosis not present

## 2015-01-16 DIAGNOSIS — Z510 Encounter for antineoplastic radiation therapy: Secondary | ICD-10-CM | POA: Diagnosis not present

## 2015-01-16 DIAGNOSIS — Z170 Estrogen receptor positive status [ER+]: Secondary | ICD-10-CM | POA: Diagnosis not present

## 2015-01-16 DIAGNOSIS — L599 Disorder of the skin and subcutaneous tissue related to radiation, unspecified: Secondary | ICD-10-CM | POA: Diagnosis not present

## 2015-01-16 DIAGNOSIS — Z9012 Acquired absence of left breast and nipple: Secondary | ICD-10-CM | POA: Diagnosis not present

## 2015-01-16 DIAGNOSIS — C50412 Malignant neoplasm of upper-outer quadrant of left female breast: Secondary | ICD-10-CM | POA: Diagnosis not present

## 2015-01-16 NOTE — Progress Notes (Signed)
Weekly assessment of radiation to left breast.Completed 12 of 30 treatments.Skin is light red.Denies pain.Mild fatigue.

## 2015-01-16 NOTE — Progress Notes (Signed)
Weekly Management Note:  Site: Left breast Current Dose:  2400  cGy Projected Dose: 4600  cGy followed by 7 fraction boost  Narrative: The patient is seen today for routine under treatment assessment. CBCT/MVCT images/port films were reviewed. The chart was reviewed.   She is without complaints today.  She uses Radioplex gel.  Physical Examination:  Filed Vitals:   01/16/15 1446  BP: 122/83  Pulse: 75  Temp: 98.3 F (36.8 C)  .  Weight: 178 lb (80.74 kg).  There is mild erythema along the left breast with no areas of desquamation.  Impression: Tolerating radiation therapy well.  Plan: Continue radiation therapy as planned.

## 2015-01-17 ENCOUNTER — Ambulatory Visit
Admission: RE | Admit: 2015-01-17 | Discharge: 2015-01-17 | Disposition: A | Discharge status: Home / Self Care | Payer: Medicare Other | Source: Ambulatory Visit | Attending: Radiation Oncology | Admitting: Radiation Oncology

## 2015-01-17 DIAGNOSIS — Z9012 Acquired absence of left breast and nipple: Secondary | ICD-10-CM | POA: Diagnosis not present

## 2015-01-17 DIAGNOSIS — L599 Disorder of the skin and subcutaneous tissue related to radiation, unspecified: Secondary | ICD-10-CM | POA: Diagnosis not present

## 2015-01-17 DIAGNOSIS — C50412 Malignant neoplasm of upper-outer quadrant of left female breast: Secondary | ICD-10-CM | POA: Diagnosis not present

## 2015-01-17 DIAGNOSIS — L814 Other melanin hyperpigmentation: Secondary | ICD-10-CM | POA: Diagnosis not present

## 2015-01-17 DIAGNOSIS — Z170 Estrogen receptor positive status [ER+]: Secondary | ICD-10-CM | POA: Diagnosis not present

## 2015-01-17 DIAGNOSIS — Z510 Encounter for antineoplastic radiation therapy: Secondary | ICD-10-CM | POA: Diagnosis not present

## 2015-01-17 DIAGNOSIS — D0512 Intraductal carcinoma in situ of left breast: Secondary | ICD-10-CM | POA: Diagnosis not present

## 2015-01-18 ENCOUNTER — Ambulatory Visit
Admission: RE | Admit: 2015-01-18 | Discharge: 2015-01-18 | Disposition: A | Discharge status: Home / Self Care | Payer: Medicare Other | Source: Ambulatory Visit | Attending: Radiation Oncology | Admitting: Radiation Oncology

## 2015-01-18 DIAGNOSIS — Z510 Encounter for antineoplastic radiation therapy: Secondary | ICD-10-CM | POA: Diagnosis not present

## 2015-01-18 DIAGNOSIS — L599 Disorder of the skin and subcutaneous tissue related to radiation, unspecified: Secondary | ICD-10-CM | POA: Diagnosis not present

## 2015-01-18 DIAGNOSIS — L814 Other melanin hyperpigmentation: Secondary | ICD-10-CM | POA: Diagnosis not present

## 2015-01-18 DIAGNOSIS — D0512 Intraductal carcinoma in situ of left breast: Secondary | ICD-10-CM | POA: Diagnosis not present

## 2015-01-18 DIAGNOSIS — Z170 Estrogen receptor positive status [ER+]: Secondary | ICD-10-CM | POA: Diagnosis not present

## 2015-01-18 DIAGNOSIS — Z9012 Acquired absence of left breast and nipple: Secondary | ICD-10-CM | POA: Diagnosis not present

## 2015-01-18 DIAGNOSIS — C50412 Malignant neoplasm of upper-outer quadrant of left female breast: Secondary | ICD-10-CM | POA: Diagnosis not present

## 2015-01-19 ENCOUNTER — Ambulatory Visit
Admission: RE | Admit: 2015-01-19 | Discharge: 2015-01-19 | Disposition: A | Discharge status: Home / Self Care | Payer: Medicare Other | Source: Ambulatory Visit | Attending: Radiation Oncology | Admitting: Radiation Oncology

## 2015-01-19 DIAGNOSIS — Z170 Estrogen receptor positive status [ER+]: Secondary | ICD-10-CM | POA: Diagnosis not present

## 2015-01-19 DIAGNOSIS — C50412 Malignant neoplasm of upper-outer quadrant of left female breast: Secondary | ICD-10-CM | POA: Diagnosis not present

## 2015-01-19 DIAGNOSIS — L599 Disorder of the skin and subcutaneous tissue related to radiation, unspecified: Secondary | ICD-10-CM | POA: Diagnosis not present

## 2015-01-19 DIAGNOSIS — Z510 Encounter for antineoplastic radiation therapy: Secondary | ICD-10-CM | POA: Diagnosis not present

## 2015-01-19 DIAGNOSIS — Z9012 Acquired absence of left breast and nipple: Secondary | ICD-10-CM | POA: Diagnosis not present

## 2015-01-19 DIAGNOSIS — L814 Other melanin hyperpigmentation: Secondary | ICD-10-CM | POA: Diagnosis not present

## 2015-01-19 DIAGNOSIS — D0512 Intraductal carcinoma in situ of left breast: Secondary | ICD-10-CM | POA: Diagnosis not present

## 2015-01-20 ENCOUNTER — Ambulatory Visit
Admission: RE | Admit: 2015-01-20 | Discharge: 2015-01-20 | Disposition: A | Discharge status: Home / Self Care | Payer: Medicare Other | Source: Ambulatory Visit | Attending: Radiation Oncology | Admitting: Radiation Oncology

## 2015-01-20 DIAGNOSIS — L599 Disorder of the skin and subcutaneous tissue related to radiation, unspecified: Secondary | ICD-10-CM | POA: Diagnosis not present

## 2015-01-20 DIAGNOSIS — Z170 Estrogen receptor positive status [ER+]: Secondary | ICD-10-CM | POA: Diagnosis not present

## 2015-01-20 DIAGNOSIS — Z9012 Acquired absence of left breast and nipple: Secondary | ICD-10-CM | POA: Diagnosis not present

## 2015-01-20 DIAGNOSIS — D0512 Intraductal carcinoma in situ of left breast: Secondary | ICD-10-CM | POA: Diagnosis not present

## 2015-01-20 DIAGNOSIS — L814 Other melanin hyperpigmentation: Secondary | ICD-10-CM | POA: Diagnosis not present

## 2015-01-20 DIAGNOSIS — C50412 Malignant neoplasm of upper-outer quadrant of left female breast: Secondary | ICD-10-CM | POA: Diagnosis not present

## 2015-01-20 DIAGNOSIS — Z510 Encounter for antineoplastic radiation therapy: Secondary | ICD-10-CM | POA: Diagnosis not present

## 2015-01-23 ENCOUNTER — Encounter: Payer: Self-pay | Admitting: Radiation Oncology

## 2015-01-23 ENCOUNTER — Ambulatory Visit
Admission: RE | Admit: 2015-01-23 | Discharge: 2015-01-23 | Disposition: A | Discharge status: Home / Self Care | Payer: Medicare Other | Source: Ambulatory Visit | Attending: Radiation Oncology | Admitting: Radiation Oncology

## 2015-01-23 VITALS — BP 143/94 | HR 65 | Temp 97.6°F | Ht 66.0 in | Wt 178.1 lb

## 2015-01-23 DIAGNOSIS — Z170 Estrogen receptor positive status [ER+]: Secondary | ICD-10-CM | POA: Diagnosis not present

## 2015-01-23 DIAGNOSIS — C50412 Malignant neoplasm of upper-outer quadrant of left female breast: Secondary | ICD-10-CM

## 2015-01-23 DIAGNOSIS — Z9012 Acquired absence of left breast and nipple: Secondary | ICD-10-CM | POA: Diagnosis not present

## 2015-01-23 DIAGNOSIS — L814 Other melanin hyperpigmentation: Secondary | ICD-10-CM | POA: Diagnosis not present

## 2015-01-23 DIAGNOSIS — D0512 Intraductal carcinoma in situ of left breast: Secondary | ICD-10-CM | POA: Diagnosis not present

## 2015-01-23 DIAGNOSIS — Z510 Encounter for antineoplastic radiation therapy: Secondary | ICD-10-CM | POA: Diagnosis not present

## 2015-01-23 DIAGNOSIS — L599 Disorder of the skin and subcutaneous tissue related to radiation, unspecified: Secondary | ICD-10-CM | POA: Diagnosis not present

## 2015-01-23 NOTE — Progress Notes (Signed)
Weekly Management Note:  Site: Left breast Current Dose:  3400  cGy Projected Dose: 4600  cGy followed by 7 fraction boost  Narrative: The patient is seen today for routine under treatment assessment. CBCT/MVCT images/port films were reviewed. The chart was reviewed.   She is without complaints today except for some tenderness within the left axilla.  Physical Examination:  Filed Vitals:   01/23/15 1424  BP: 143/94  Pulse: 65  Temp:   .  Weight: 178 lb 1.6 oz (80.786 kg).  There is hyperpigmentation the skin with no areas of desquamation.  Impression: Tolerating radiation therapy well.  Plan: Continue radiation therapy as planned.

## 2015-01-23 NOTE — Progress Notes (Signed)
Ms. Sukhu has recevied 17 fractions to her left breast.  Note hyperpigmentation and tanning of her tx field.  She reports tenderness in the left axilla.

## 2015-01-24 ENCOUNTER — Ambulatory Visit
Admission: RE | Admit: 2015-01-24 | Discharge: 2015-01-24 | Disposition: A | Discharge status: Home / Self Care | Payer: Medicare Other | Source: Ambulatory Visit | Attending: Radiation Oncology | Admitting: Radiation Oncology

## 2015-01-24 DIAGNOSIS — L814 Other melanin hyperpigmentation: Secondary | ICD-10-CM | POA: Diagnosis not present

## 2015-01-24 DIAGNOSIS — D0512 Intraductal carcinoma in situ of left breast: Secondary | ICD-10-CM | POA: Diagnosis not present

## 2015-01-24 DIAGNOSIS — C50412 Malignant neoplasm of upper-outer quadrant of left female breast: Secondary | ICD-10-CM | POA: Diagnosis not present

## 2015-01-24 DIAGNOSIS — Z9012 Acquired absence of left breast and nipple: Secondary | ICD-10-CM | POA: Diagnosis not present

## 2015-01-24 DIAGNOSIS — Z510 Encounter for antineoplastic radiation therapy: Secondary | ICD-10-CM | POA: Diagnosis not present

## 2015-01-24 DIAGNOSIS — Z170 Estrogen receptor positive status [ER+]: Secondary | ICD-10-CM | POA: Diagnosis not present

## 2015-01-24 DIAGNOSIS — L599 Disorder of the skin and subcutaneous tissue related to radiation, unspecified: Secondary | ICD-10-CM | POA: Diagnosis not present

## 2015-01-25 ENCOUNTER — Ambulatory Visit
Admission: RE | Admit: 2015-01-25 | Discharge: 2015-01-25 | Disposition: A | Discharge status: Home / Self Care | Payer: Medicare Other | Source: Ambulatory Visit | Attending: Radiation Oncology | Admitting: Radiation Oncology

## 2015-01-25 DIAGNOSIS — L599 Disorder of the skin and subcutaneous tissue related to radiation, unspecified: Secondary | ICD-10-CM | POA: Diagnosis not present

## 2015-01-25 DIAGNOSIS — Z170 Estrogen receptor positive status [ER+]: Secondary | ICD-10-CM | POA: Diagnosis not present

## 2015-01-25 DIAGNOSIS — Z9012 Acquired absence of left breast and nipple: Secondary | ICD-10-CM | POA: Diagnosis not present

## 2015-01-25 DIAGNOSIS — Z510 Encounter for antineoplastic radiation therapy: Secondary | ICD-10-CM | POA: Diagnosis not present

## 2015-01-25 DIAGNOSIS — C50412 Malignant neoplasm of upper-outer quadrant of left female breast: Secondary | ICD-10-CM | POA: Diagnosis not present

## 2015-01-25 DIAGNOSIS — L814 Other melanin hyperpigmentation: Secondary | ICD-10-CM | POA: Diagnosis not present

## 2015-01-25 DIAGNOSIS — D0512 Intraductal carcinoma in situ of left breast: Secondary | ICD-10-CM | POA: Diagnosis not present

## 2015-01-26 ENCOUNTER — Ambulatory Visit
Admission: RE | Admit: 2015-01-26 | Discharge: 2015-01-26 | Disposition: A | Discharge status: Home / Self Care | Payer: Medicare Other | Source: Ambulatory Visit | Attending: Radiation Oncology | Admitting: Radiation Oncology

## 2015-01-26 DIAGNOSIS — L814 Other melanin hyperpigmentation: Secondary | ICD-10-CM | POA: Diagnosis not present

## 2015-01-26 DIAGNOSIS — Z510 Encounter for antineoplastic radiation therapy: Secondary | ICD-10-CM | POA: Diagnosis not present

## 2015-01-26 DIAGNOSIS — C50412 Malignant neoplasm of upper-outer quadrant of left female breast: Secondary | ICD-10-CM | POA: Diagnosis not present

## 2015-01-26 DIAGNOSIS — Z170 Estrogen receptor positive status [ER+]: Secondary | ICD-10-CM | POA: Diagnosis not present

## 2015-01-26 DIAGNOSIS — Z9012 Acquired absence of left breast and nipple: Secondary | ICD-10-CM | POA: Diagnosis not present

## 2015-01-26 DIAGNOSIS — D0512 Intraductal carcinoma in situ of left breast: Secondary | ICD-10-CM | POA: Diagnosis not present

## 2015-01-26 DIAGNOSIS — L599 Disorder of the skin and subcutaneous tissue related to radiation, unspecified: Secondary | ICD-10-CM | POA: Diagnosis not present

## 2015-01-26 NOTE — Progress Notes (Addendum)
Complex simulation note: The patient was taken back to the CT simulator to determine whether or not there would be regression of the lumpectomy cavity seroma.  She was scanned free breathing.  There was mild  regression of her tumor bed seroma, but not to the point that I would use this CT data set.  We will plan the boost from deep inspiration breath-hold data set at the time of her initial simulation.  I am prescribing 1400 cGy in 7 sessions.

## 2015-01-27 ENCOUNTER — Ambulatory Visit
Admission: RE | Admit: 2015-01-27 | Discharge: 2015-01-27 | Disposition: A | Discharge status: Home / Self Care | Payer: Medicare Other | Source: Ambulatory Visit | Attending: Radiation Oncology | Admitting: Radiation Oncology

## 2015-01-27 DIAGNOSIS — L814 Other melanin hyperpigmentation: Secondary | ICD-10-CM | POA: Diagnosis not present

## 2015-01-27 DIAGNOSIS — Z510 Encounter for antineoplastic radiation therapy: Secondary | ICD-10-CM | POA: Diagnosis not present

## 2015-01-27 DIAGNOSIS — D0512 Intraductal carcinoma in situ of left breast: Secondary | ICD-10-CM | POA: Diagnosis not present

## 2015-01-27 DIAGNOSIS — C50412 Malignant neoplasm of upper-outer quadrant of left female breast: Secondary | ICD-10-CM | POA: Diagnosis not present

## 2015-01-27 DIAGNOSIS — Z170 Estrogen receptor positive status [ER+]: Secondary | ICD-10-CM | POA: Diagnosis not present

## 2015-01-27 DIAGNOSIS — L599 Disorder of the skin and subcutaneous tissue related to radiation, unspecified: Secondary | ICD-10-CM | POA: Diagnosis not present

## 2015-01-27 DIAGNOSIS — Z9012 Acquired absence of left breast and nipple: Secondary | ICD-10-CM | POA: Diagnosis not present

## 2015-01-30 ENCOUNTER — Ambulatory Visit
Admission: RE | Admit: 2015-01-30 | Discharge: 2015-01-30 | Disposition: A | Discharge status: Home / Self Care | Payer: Medicare Other | Source: Ambulatory Visit | Attending: Radiation Oncology | Admitting: Radiation Oncology

## 2015-01-30 VITALS — BP 119/95 | HR 73 | Temp 98.5°F | Wt 179.3 lb

## 2015-01-30 DIAGNOSIS — Z9012 Acquired absence of left breast and nipple: Secondary | ICD-10-CM | POA: Diagnosis not present

## 2015-01-30 DIAGNOSIS — Z170 Estrogen receptor positive status [ER+]: Secondary | ICD-10-CM | POA: Diagnosis not present

## 2015-01-30 DIAGNOSIS — Z510 Encounter for antineoplastic radiation therapy: Secondary | ICD-10-CM | POA: Diagnosis not present

## 2015-01-30 DIAGNOSIS — C50412 Malignant neoplasm of upper-outer quadrant of left female breast: Secondary | ICD-10-CM

## 2015-01-30 DIAGNOSIS — L599 Disorder of the skin and subcutaneous tissue related to radiation, unspecified: Secondary | ICD-10-CM | POA: Diagnosis not present

## 2015-01-30 DIAGNOSIS — L814 Other melanin hyperpigmentation: Secondary | ICD-10-CM | POA: Diagnosis not present

## 2015-01-30 DIAGNOSIS — D0512 Intraductal carcinoma in situ of left breast: Secondary | ICD-10-CM | POA: Diagnosis not present

## 2015-01-30 NOTE — Progress Notes (Signed)
Weekly Management Note:  Site: Breast  Current Dose:  4400  cGy Projected Dose: 4600  cGy followed by left breast boost  Narrative: The patient is seen today for routine under treatment assessment. CBCT/MVCT images/port films were reviewed. The chart was reviewed.   She is doing doing well and is without complaints today except for a sprained left thumb.  Physical Examination:  Filed Vitals:   01/30/15 1424  BP: 119/95  Pulse: 73  Temp: 98.5 F (36.9 C)  .  Weight: 179 lb 4.8 oz (81.33 kg).  There is hyperpigmentation the skin along the left breast but no obvious desquamation.  Impression: Tolerating radiation therapy well.  Plan: Continue radiation therapy as planned.  She tells me that she thinks she will meet with Dr. Dr. Jana Hakim in early May for discussion of antiestrogen therapy.

## 2015-01-30 NOTE — Progress Notes (Signed)
Weekly assessment of radiation to left OrthoTraffic.ch 22 of 30 treatments.Skin is mildly tanned without evidence of peeling or pain.Has some pain of left wrist along thumb.Will take ibuprofen q6h for a few days to get relief.Noticed that she sleeps more but denies fatigue.BP 119/95 mmHg  Pulse 73  Temp(Src) 98.5 F (36.9 C)  Wt 179 lb 4.8 oz (81.33 kg)

## 2015-01-31 ENCOUNTER — Encounter: Payer: Self-pay | Admitting: Radiation Oncology

## 2015-01-31 ENCOUNTER — Ambulatory Visit
Admission: RE | Admit: 2015-01-31 | Discharge: 2015-01-31 | Disposition: A | Discharge status: Home / Self Care | Payer: Medicare Other | Source: Ambulatory Visit | Attending: Radiation Oncology | Admitting: Radiation Oncology

## 2015-01-31 DIAGNOSIS — L814 Other melanin hyperpigmentation: Secondary | ICD-10-CM | POA: Diagnosis not present

## 2015-01-31 DIAGNOSIS — D0512 Intraductal carcinoma in situ of left breast: Secondary | ICD-10-CM | POA: Diagnosis not present

## 2015-01-31 DIAGNOSIS — Z9012 Acquired absence of left breast and nipple: Secondary | ICD-10-CM | POA: Diagnosis not present

## 2015-01-31 DIAGNOSIS — Z510 Encounter for antineoplastic radiation therapy: Secondary | ICD-10-CM | POA: Diagnosis not present

## 2015-01-31 DIAGNOSIS — Z170 Estrogen receptor positive status [ER+]: Secondary | ICD-10-CM | POA: Diagnosis not present

## 2015-01-31 DIAGNOSIS — L599 Disorder of the skin and subcutaneous tissue related to radiation, unspecified: Secondary | ICD-10-CM | POA: Diagnosis not present

## 2015-01-31 DIAGNOSIS — C50412 Malignant neoplasm of upper-outer quadrant of left female breast: Secondary | ICD-10-CM | POA: Diagnosis not present

## 2015-01-31 NOTE — Progress Notes (Signed)
Complex simulation note: The patient underwent virtual simulation for her left breast boost.  She was set up to 3 field technique because of her tumor bed depth.  3 unique MLCs were designed to conform the field.  She was planned with deep inspiration breath-hold.  I prescribing 1400 cGy in 7 sessions utilizing 6 MV photons.  An isodose plan was reviewed and accepted.

## 2015-02-01 ENCOUNTER — Ambulatory Visit
Admission: RE | Admit: 2015-02-01 | Discharge: 2015-02-01 | Disposition: A | Discharge status: Home / Self Care | Payer: Medicare Other | Source: Ambulatory Visit | Attending: Radiation Oncology | Admitting: Radiation Oncology

## 2015-02-01 DIAGNOSIS — Z510 Encounter for antineoplastic radiation therapy: Secondary | ICD-10-CM | POA: Diagnosis not present

## 2015-02-01 DIAGNOSIS — Z170 Estrogen receptor positive status [ER+]: Secondary | ICD-10-CM | POA: Diagnosis not present

## 2015-02-01 DIAGNOSIS — D0512 Intraductal carcinoma in situ of left breast: Secondary | ICD-10-CM | POA: Diagnosis not present

## 2015-02-01 DIAGNOSIS — L599 Disorder of the skin and subcutaneous tissue related to radiation, unspecified: Secondary | ICD-10-CM | POA: Diagnosis not present

## 2015-02-01 DIAGNOSIS — C50412 Malignant neoplasm of upper-outer quadrant of left female breast: Secondary | ICD-10-CM | POA: Diagnosis not present

## 2015-02-01 DIAGNOSIS — L814 Other melanin hyperpigmentation: Secondary | ICD-10-CM | POA: Diagnosis not present

## 2015-02-01 DIAGNOSIS — Z9012 Acquired absence of left breast and nipple: Secondary | ICD-10-CM | POA: Diagnosis not present

## 2015-02-02 ENCOUNTER — Ambulatory Visit
Admission: RE | Admit: 2015-02-02 | Discharge: 2015-02-02 | Disposition: A | Discharge status: Home / Self Care | Payer: Medicare Other | Source: Ambulatory Visit | Attending: Radiation Oncology | Admitting: Radiation Oncology

## 2015-02-02 DIAGNOSIS — D0512 Intraductal carcinoma in situ of left breast: Secondary | ICD-10-CM | POA: Diagnosis not present

## 2015-02-02 DIAGNOSIS — Z170 Estrogen receptor positive status [ER+]: Secondary | ICD-10-CM | POA: Diagnosis not present

## 2015-02-02 DIAGNOSIS — L814 Other melanin hyperpigmentation: Secondary | ICD-10-CM | POA: Diagnosis not present

## 2015-02-02 DIAGNOSIS — Z510 Encounter for antineoplastic radiation therapy: Secondary | ICD-10-CM | POA: Diagnosis not present

## 2015-02-02 DIAGNOSIS — C50412 Malignant neoplasm of upper-outer quadrant of left female breast: Secondary | ICD-10-CM | POA: Diagnosis not present

## 2015-02-02 DIAGNOSIS — Z9012 Acquired absence of left breast and nipple: Secondary | ICD-10-CM | POA: Diagnosis not present

## 2015-02-02 DIAGNOSIS — L599 Disorder of the skin and subcutaneous tissue related to radiation, unspecified: Secondary | ICD-10-CM | POA: Diagnosis not present

## 2015-02-03 ENCOUNTER — Ambulatory Visit
Admission: RE | Admit: 2015-02-03 | Discharge: 2015-02-03 | Disposition: A | Discharge status: Home / Self Care | Payer: Medicare Other | Source: Ambulatory Visit | Attending: Radiation Oncology | Admitting: Radiation Oncology

## 2015-02-03 DIAGNOSIS — Z170 Estrogen receptor positive status [ER+]: Secondary | ICD-10-CM | POA: Diagnosis not present

## 2015-02-03 DIAGNOSIS — C50412 Malignant neoplasm of upper-outer quadrant of left female breast: Secondary | ICD-10-CM | POA: Diagnosis not present

## 2015-02-03 DIAGNOSIS — L599 Disorder of the skin and subcutaneous tissue related to radiation, unspecified: Secondary | ICD-10-CM | POA: Diagnosis not present

## 2015-02-03 DIAGNOSIS — L814 Other melanin hyperpigmentation: Secondary | ICD-10-CM | POA: Diagnosis not present

## 2015-02-03 DIAGNOSIS — Z510 Encounter for antineoplastic radiation therapy: Secondary | ICD-10-CM | POA: Diagnosis not present

## 2015-02-03 DIAGNOSIS — Z9012 Acquired absence of left breast and nipple: Secondary | ICD-10-CM | POA: Diagnosis not present

## 2015-02-03 DIAGNOSIS — D0512 Intraductal carcinoma in situ of left breast: Secondary | ICD-10-CM | POA: Diagnosis not present

## 2015-02-06 ENCOUNTER — Ambulatory Visit
Admission: RE | Admit: 2015-02-06 | Discharge: 2015-02-06 | Disposition: A | Discharge status: Home / Self Care | Payer: Medicare Other | Source: Ambulatory Visit | Attending: Radiation Oncology | Admitting: Radiation Oncology

## 2015-02-06 VITALS — BP 115/80 | HR 69 | Temp 98.2°F | Wt 179.8 lb

## 2015-02-06 DIAGNOSIS — C50412 Malignant neoplasm of upper-outer quadrant of left female breast: Secondary | ICD-10-CM | POA: Diagnosis not present

## 2015-02-06 DIAGNOSIS — Z9012 Acquired absence of left breast and nipple: Secondary | ICD-10-CM | POA: Diagnosis not present

## 2015-02-06 DIAGNOSIS — Z510 Encounter for antineoplastic radiation therapy: Secondary | ICD-10-CM | POA: Diagnosis not present

## 2015-02-06 DIAGNOSIS — Z170 Estrogen receptor positive status [ER+]: Secondary | ICD-10-CM | POA: Diagnosis not present

## 2015-02-06 DIAGNOSIS — L814 Other melanin hyperpigmentation: Secondary | ICD-10-CM | POA: Diagnosis not present

## 2015-02-06 DIAGNOSIS — D0512 Intraductal carcinoma in situ of left breast: Secondary | ICD-10-CM | POA: Diagnosis not present

## 2015-02-06 DIAGNOSIS — L599 Disorder of the skin and subcutaneous tissue related to radiation, unspecified: Secondary | ICD-10-CM | POA: Diagnosis not present

## 2015-02-06 MED ORDER — RADIAPLEXRX EX GEL
Freq: Once | CUTANEOUS | Status: AC
  Administered 2015-02-06: 14:00:00 via TOPICAL

## 2015-02-06 NOTE — Progress Notes (Signed)
Weekly Management Note:  Site: Left breast Current Dose:  5400  cGy Projected Dose: 6000  cGy  Narrative: The patient is seen today for routine under treatment assessment. CBCT/MVCT images/port films were reviewed. The chart was reviewed.   She is without complaints today.  She uses Radioplex gel.  Physical Examination:  Filed Vitals:   02/06/15 1407  BP: 115/80  Pulse: 69  Temp: 98.2 F (36.8 C)  .  Weight: 179 lb 12.8 oz (81.557 kg).  There is hyperpigmentation the skin and patchy dry desquamation along the inframammary region.  Impression: Tolerating radiation therapy well.  She will finish radiation therapy this Thursday.  Plan: Continue radiation therapy as planned.  One-month follow-up visit after completion of radiation therapy.

## 2015-02-06 NOTE — Progress Notes (Addendum)
Weekly assessment of radiation to left breast.Completed 27 of 30 treatments.Skin is red.Continue application of radiaplex twice daily along with aloe vera.Discharge instructions provided. One month follow up scheduled for 03/14/15 with survivorship appointment after .Skin sensitive to touch but no pain.Given an additional tube of radiaplex.Knows to call if any questions or concerns.

## 2015-02-07 ENCOUNTER — Ambulatory Visit
Admission: RE | Admit: 2015-02-07 | Discharge: 2015-02-07 | Disposition: A | Discharge status: Home / Self Care | Payer: Medicare Other | Source: Ambulatory Visit | Attending: Radiation Oncology | Admitting: Radiation Oncology

## 2015-02-07 DIAGNOSIS — L814 Other melanin hyperpigmentation: Secondary | ICD-10-CM | POA: Diagnosis not present

## 2015-02-07 DIAGNOSIS — Z510 Encounter for antineoplastic radiation therapy: Secondary | ICD-10-CM | POA: Diagnosis not present

## 2015-02-07 DIAGNOSIS — Z170 Estrogen receptor positive status [ER+]: Secondary | ICD-10-CM | POA: Diagnosis not present

## 2015-02-07 DIAGNOSIS — L599 Disorder of the skin and subcutaneous tissue related to radiation, unspecified: Secondary | ICD-10-CM | POA: Diagnosis not present

## 2015-02-07 DIAGNOSIS — D0512 Intraductal carcinoma in situ of left breast: Secondary | ICD-10-CM | POA: Diagnosis not present

## 2015-02-07 DIAGNOSIS — Z9012 Acquired absence of left breast and nipple: Secondary | ICD-10-CM | POA: Diagnosis not present

## 2015-02-07 DIAGNOSIS — C50412 Malignant neoplasm of upper-outer quadrant of left female breast: Secondary | ICD-10-CM | POA: Diagnosis not present

## 2015-02-08 ENCOUNTER — Ambulatory Visit: Payer: Medicare Other

## 2015-02-08 ENCOUNTER — Ambulatory Visit
Admission: RE | Admit: 2015-02-08 | Discharge: 2015-02-08 | Disposition: A | Discharge status: Home / Self Care | Payer: Medicare Other | Source: Ambulatory Visit | Attending: Radiation Oncology | Admitting: Radiation Oncology

## 2015-02-08 DIAGNOSIS — Z170 Estrogen receptor positive status [ER+]: Secondary | ICD-10-CM | POA: Diagnosis not present

## 2015-02-08 DIAGNOSIS — D0512 Intraductal carcinoma in situ of left breast: Secondary | ICD-10-CM | POA: Diagnosis not present

## 2015-02-08 DIAGNOSIS — Z9012 Acquired absence of left breast and nipple: Secondary | ICD-10-CM | POA: Diagnosis not present

## 2015-02-08 DIAGNOSIS — C50412 Malignant neoplasm of upper-outer quadrant of left female breast: Secondary | ICD-10-CM | POA: Diagnosis not present

## 2015-02-08 DIAGNOSIS — Z510 Encounter for antineoplastic radiation therapy: Secondary | ICD-10-CM | POA: Diagnosis not present

## 2015-02-08 DIAGNOSIS — L599 Disorder of the skin and subcutaneous tissue related to radiation, unspecified: Secondary | ICD-10-CM | POA: Diagnosis not present

## 2015-02-08 DIAGNOSIS — L814 Other melanin hyperpigmentation: Secondary | ICD-10-CM | POA: Diagnosis not present

## 2015-02-09 ENCOUNTER — Ambulatory Visit
Admission: RE | Admit: 2015-02-09 | Discharge: 2015-02-09 | Disposition: A | Discharge status: Home / Self Care | Payer: Medicare Other | Source: Ambulatory Visit | Attending: Radiation Oncology | Admitting: Radiation Oncology

## 2015-02-09 ENCOUNTER — Ambulatory Visit: Payer: Medicare Other

## 2015-02-09 DIAGNOSIS — Z510 Encounter for antineoplastic radiation therapy: Secondary | ICD-10-CM | POA: Diagnosis not present

## 2015-02-09 DIAGNOSIS — C50412 Malignant neoplasm of upper-outer quadrant of left female breast: Secondary | ICD-10-CM | POA: Diagnosis not present

## 2015-02-09 DIAGNOSIS — L814 Other melanin hyperpigmentation: Secondary | ICD-10-CM | POA: Diagnosis not present

## 2015-02-09 DIAGNOSIS — L599 Disorder of the skin and subcutaneous tissue related to radiation, unspecified: Secondary | ICD-10-CM | POA: Diagnosis not present

## 2015-02-09 DIAGNOSIS — Z9012 Acquired absence of left breast and nipple: Secondary | ICD-10-CM | POA: Diagnosis not present

## 2015-02-09 DIAGNOSIS — D0512 Intraductal carcinoma in situ of left breast: Secondary | ICD-10-CM | POA: Diagnosis not present

## 2015-02-09 DIAGNOSIS — Z170 Estrogen receptor positive status [ER+]: Secondary | ICD-10-CM | POA: Diagnosis not present

## 2015-02-13 ENCOUNTER — Encounter: Payer: Self-pay | Admitting: Radiation Oncology

## 2015-02-13 NOTE — Progress Notes (Signed)
Similar to verification note: The patient underwent simulation verification for her left breast boost.  She was set up to 3 field technique.  Her isocenter was in good position and the multileaf collimators contoured the treatment volume appropriately.

## 2015-02-13 NOTE — Progress Notes (Signed)
[facility] End of Treatment Note  Name:Linda Singh  Date: 02/13/2015 VAN:[id number] DOB:[birth year]   Status:outpatient    CC: London Pepper, MD  Dr. Stark Klein  REFERRING PHYSICIAN: Dr. Stark Klein    DIAGNOSIS: Stage I A (T1a N0 M0) invasive ductal/DCIS of the left breast   INDICATION FOR TREATMENT: Curative   TREATMENT DATES: 12/29/2014 through 02/09/2015                          SITE/DOSE:   Left breast 4600 cGy in 23 sessions left breast boost 1400 cGy in 7 sessions                         BEAMS/ENERGY:   Tangential fields to the left breast with deep inspiration breath-hold, 6 MV photons.  Three-field photon boost to left breast with 6 MV photons.                NARRATIVE: The patient tolerated treatment well with the expected degree of hyperpigmentation and dry desquamation of the skin, particularly along the left inframammary region by completion of therapy.  No areas of moist desquamation.                           PLAN: Routine followup in one month. Patient instructed to call if questions or worsening complaints in interim.

## 2015-02-14 ENCOUNTER — Telehealth: Payer: Self-pay | Admitting: Oncology

## 2015-02-14 NOTE — Telephone Encounter (Signed)
Returned Advertising account executive. Left message to confirm appointment still at 11 due to no later appointment.

## 2015-02-15 ENCOUNTER — Ambulatory Visit (HOSPITAL_BASED_OUTPATIENT_CLINIC_OR_DEPARTMENT_OTHER): Payer: Medicare Other | Admitting: Oncology

## 2015-02-15 ENCOUNTER — Telehealth: Payer: Self-pay | Admitting: Oncology

## 2015-02-15 ENCOUNTER — Other Ambulatory Visit: Payer: Self-pay | Admitting: *Deleted

## 2015-02-15 VITALS — BP 141/83 | HR 64 | Temp 97.9°F | Resp 18 | Ht 66.0 in | Wt 178.2 lb

## 2015-02-15 DIAGNOSIS — C50412 Malignant neoplasm of upper-outer quadrant of left female breast: Secondary | ICD-10-CM | POA: Diagnosis present

## 2015-02-15 DIAGNOSIS — Z170 Estrogen receptor positive status [ER+]: Secondary | ICD-10-CM | POA: Diagnosis not present

## 2015-02-15 DIAGNOSIS — E559 Vitamin D deficiency, unspecified: Secondary | ICD-10-CM

## 2015-02-15 MED ORDER — ANASTROZOLE 1 MG PO TABS: 1.0000 mg | ORAL_TABLET | Freq: Every day | ORAL | Status: DC

## 2015-02-15 NOTE — Progress Notes (Signed)
[place]  Telephone:(336) [phone] Fax:(336) [phone]     ID: Linda Singh DOB: [birth year]  MR#: [id number]  ZYS#:[id number]  [facility] Care Team: London Pepper, MD as PCP - General (Family Medicine) Stark Klein, MD as Consulting Physician (General Surgery) Arloa Koh, MD as Consulting Physician (Radiation Oncology) Chauncey Cruel, MD as Consulting Physician (Oncology) Holley Bouche, NP as Nurse Practitioner (Nurse Practitioner) Rockwell Germany, RN as Registered Nurse Mauro Kaufmann, RN as Registered Nurse OTHER MD:  CHIEF COMPLAINT: Ductal carcinoma in situ  CURRENT TREATMENT: Awaiting adjuvant radiation   BREAST CANCER HISTORY: From Linda original intake note:  Linda Singh [facility] going showing her breast density to be category B. In Linda left breast new calcifications were noted, and these were further evaluated with left diagnostic mammography 09/27/2014. Linda calcifications when Linda upper outer quadrant, and were mildly irregular and linear. Biopsy was performed 10/11/2014 and showed ([id number]) ductal carcinoma in situ, grade 2, estrogen receptor 100% positive, progesterone receptor 30% positive, both with strong staining intensity.  MRI was scheduled but Linda Singh was unable to tolerate it.  Her subsequent history is as detailed below  INTERVAL HISTORY: Linda Singh returns today for follow-up of her early stage breast cancer, accompanied by a friend. Since her last visit here Linda Singh completed her radiation treatments. Linda Singh generally did well with those, with mild fatigue and some hyperpigmentation of Linda skin, but no other significant complications. Linda Singh is now ready to start anti-estrogens.  REVIEW OF SYSTEMS: A detailed review of systems today was otherwise noncontributory  PAST MEDICAL HISTORY: Past Medical History  Diagnosis Date  . Hypertension     hx  . Wears glasses   . Wears partial  dentures   . Breast cancer, left breast 11/02/14    Upper, Outer Quadrant    PAST SURGICAL HISTORY: Past Surgical History  Procedure Laterality Date  . Abdominal hysterectomy    . Colonoscopy    . Radioactive seed guided mastectomy with axillary sentinel lymph node biopsy Left 11/02/2014    Procedure: RADIOACTIVE SEED GUIDED PARTIAL MASTECTOMY WITH AXILLARY SENTINEL LYMPH NODE BIOPSY;  Surgeon: Stark Klein, MD;  Location: [facility];  Service: General;  Laterality: Left;  . Re-excision of breast lumpectomy Left 11/17/2014    Procedure: RE-EXCISION OF LEFT BREAST LUMPECTOMY;  Surgeon: Stark Klein, MD;  Location: [facility];  Service: General;  Laterality: Left;    FAMILY HISTORY Family History  Problem Relation Age of Onset  . Heart attack Father    Linda Singh's father died in an automobile accident at age 73. Linda Singh's mother died at age 8. Linda Singh had 2 brothers, 6 sisters. There is no history of breast or ovarian cancer in Linda family.  GYNECOLOGIC HISTORY:  No LMP recorded. Singh is postmenopausal. Menarche age 87. First live birth age 16. Linda Singh is GX P3. Linda Singh status post total abdominal hysterectomy with bilateral salpingo-oophorectomy. Linda Singh did not take hormone replacement. 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 Her husband Major is an Arboriculturist. Her son [name] works as a Psychologist, sport and exercise in [place]. Her son [name] is Quarry manager. of [employer] in [place]. Linda Singh has 3 grandchildren. Linda Singh attends [employer]    ADVANCED DIRECTIVES: Not in place   HEALTH MAINTENANCE: History  Substance Use Topics  . Smoking status:  Never Smoker   . Smokeless tobacco: Never Used  . Alcohol Use: No     Colonoscopy: 2015/[facility]  PAP: Status post hysterectomy  Bone density: February 2015/normal  Lipid panel:  Allergies    Allergen Reactions  . Amoxicillin     Does not know  . Contrast Media [Iodinated Diagnostic Agents] Nausea And Vomiting    Current Outpatient Prescriptions  Medication Sig Dispense Refill  . calcium carbonate (OS-CAL) 600 MG TABS tablet Take 600 mg by mouth 2 (two) times daily with a meal.    . hydrochlorothiazide (HYDRODIURIL) 25 MG tablet Take 25 mg by mouth daily.    Marland Kitchen ibuprofen (ADVIL,MOTRIN) 200 MG tablet Take 200 mg by mouth every 6 (six) hours as needed.    . non-metallic deodorant Jethro Poling) MISC Apply 1 application topically daily as needed.    Marland Kitchen omega-3 acid ethyl esters (LOVAZA) 1 G capsule Take 1 g by mouth daily.     No current facility-administered medications for this visit.    OBJECTIVE: Older African-American woman who appears well Filed Vitals:   02/15/15 1134  BP: 141/83  Pulse: 64  Temp: 97.9 F (36.6 C)  Resp: 18     Body mass index is 28.78 kg/(m^2).    ECOG FS:0 - Asymptomatic  Sclerae unicteric, EOMs intact Oropharynx clear, dentition in good repair No cervical or supraclavicular adenopathy Lungs no rales or rhonchi Heart regular rate and rhythm Abd soft, nontender, positive bowel sounds MSK no focal spinal tenderness, no upper extremity lymphedema Neuro: nonfocal, well oriented, positive affect Breasts: Linda right breast is unremarkable. Linda left breast is status post recent lumpectomy and radiation. There is mild hyperpigmentation. There is no abnormality by palpation and no evidence of recurrence or residual disease. Linda left axilla is benign  Labs  CMP     Component Value Date/Time   NA 139 10/19/2014 1155   K 3.5 10/19/2014 1155   CO2 27 10/19/2014 1155   GLUCOSE 101 10/19/2014 1155   BUN 14.5 10/19/2014 1155   CREATININE 0.9 10/19/2014 1155   CALCIUM 9.5 10/19/2014 1155   PROT 7.1 10/19/2014 1155   ALBUMIN 4.0 10/19/2014 1155   AST 23 10/19/2014 1155   ALT 11 10/19/2014 1155   ALKPHOS 62 10/19/2014 1155   BILITOT 0.89 10/19/2014 1155     INo results found for: SPEP, UPEP  Lab Results  Component Value Date   WBC 6.5 10/19/2014   NEUTROABS 4.0 10/19/2014   HGB 14.1 10/19/2014   HCT 43.4 10/19/2014   MCV 92.8 10/19/2014   PLT 219 10/19/2014      Chemistry      Component Value Date/Time   NA 139 10/19/2014 1155   K 3.5 10/19/2014 1155   CO2 27 10/19/2014 1155   BUN 14.5 10/19/2014 1155   CREATININE 0.9 10/19/2014 1155      Component Value Date/Time   CALCIUM 9.5 10/19/2014 1155   ALKPHOS 62 10/19/2014 1155   AST 23 10/19/2014 1155   ALT 11 10/19/2014 1155   BILITOT 0.89 10/19/2014 1155       No results found for: LABCA2  No components found for: LABCA125  No results for input(s): INR in Linda last 168 hours.  Urinalysis No results found for: COLORURINE, APPEARANCEUR, LABSPEC, PHURINE, GLUCOSEU, HGBUR, BILIRUBINUR, KETONESUR, PROTEINUR, UROBILINOGEN, NITRITE, LEUKOCYTESUR  STUDIES: No results found.  ASSESSMENT: 74 y.o. [place] woman status post left breast upper outer quadrant biopsy 10/11/2014 showing ductal carcinoma in situ, grade 2, estrogen and progesterone  receptor positive  (1) status post left lumpectomy and sentinel lymph node sampling for a pT1a pN0, stage IA invasive ductal carcinoma, grade 2, estrogen receptor 100% positive, progesterone receptor 76% positive, with an MIB-1 of 17%, and no HER-2 amplification. Margins were focally involved by ductal carcinoma in situ  (a) additional surgery to 02/01/2015 showed DCIS still close but now negative margins   (2) received adjuvant radiation 12/29/2014 through 02/09/2015: Left breast 4600 cGy in 23 sessions, left breast boost 1400 cGy in 7 sessions  (3) anastrozole started 02/16/2015   (a) bone density 12/06/2013 was normal  PLAN: Ms. Casanas did fine with radiation and is now ready to start anti-estrogens.  Linda Singh understands there is small invasive breast cancers like what Linda Singh had have a very low risk of recurrence. That is why  we do not use chemotherapy in that setting. Nevertheless antiestrogen pills will cut that small risk in half  Similarly, by taking radiation therapy, Linda Singh has significantly decrease her risk of recurrence of Linda noninvasive portion of her breast cancer. I would calculate Linda residual risk to be in Linda 10% range. Of course that is not life-threatening. Nevertheless if Linda Singh took anti-estrogens that risk also would be cut in half.  Finally Linda Singh has an approximately 1% per year chance of developing another breast cancer. Linda Singh can cut that in half as well by taking anti-estrogens.  We then discussed Linda difference between anastrozole and tamoxifen. After full discussion of Linda possible toxicities, side effects and complications of these agents, which was given to her in writing, Linda Singh decided to try anastrozole first.  [place] has a good understanding of Linda overall plan. Linda Singh agrees with it. Linda Singh knows Linda goal of treatment in her case is cure. Linda Singh will call with any problems that may develop before her next visit here, which will be in 3 months.  Chauncey Cruel, MD   02/15/2015 12:30 PM Medical Oncology and Hematology [facility] [address] [place], [place] [address] Tel. [phone]    Fax. [phone]

## 2015-02-15 NOTE — Telephone Encounter (Signed)
Patient called from [facility] on [address].  "The medication was not called in yet to [facility] in [place] and need to get this now."  Read today's office note.  Sent order eRx.  Called [facility] and Judson Roch "Has already [place] it".

## 2015-02-15 NOTE — Telephone Encounter (Signed)
Appointments made and avs printed for patient °

## 2015-02-28 ENCOUNTER — Telehealth: Payer: Self-pay | Admitting: Adult Health

## 2015-02-28 NOTE — Telephone Encounter (Signed)
I left a message for Ms. Hinkson to reschedule her Survivorship [facility] appointment to try to coordinate with her newly rescheduled routine appt with Dr. Valere Dross on 03/16/15. She is currently scheduled to see me on 03/14/15, but I'd like to see her on the same day she sees Dr. Valere Dross for her convenience. I left my direct office number for her to return my call when able.   Mike Craze, NP [place] [phone]

## 2015-03-01 DIAGNOSIS — M654 Radial styloid tenosynovitis [de Quervain]: Secondary | ICD-10-CM | POA: Diagnosis not present

## 2015-03-01 DIAGNOSIS — M25532 Pain in left wrist: Secondary | ICD-10-CM | POA: Diagnosis not present

## 2015-03-08 DIAGNOSIS — M654 Radial styloid tenosynovitis [de Quervain]: Secondary | ICD-10-CM | POA: Diagnosis not present

## 2015-03-09 ENCOUNTER — Telehealth: Payer: Self-pay | Admitting: Adult Health

## 2015-03-09 NOTE — Telephone Encounter (Signed)
I left a voicemail for Ms. Borre to return my call to reschedule her Survivorship Clinic appt to potentially coordinate with her routine f/u appt with Dr. Valere Dross on 03/16/15.  I left my direct office number for her to return my call and look forward to participating in her care.   Mike Craze, NP [place] [phone]

## 2015-03-14 ENCOUNTER — Ambulatory Visit: Payer: Medicare Other | Admitting: Adult Health

## 2015-03-14 ENCOUNTER — Telehealth: Payer: Self-pay | Admitting: Adult Health

## 2015-03-14 ENCOUNTER — Ambulatory Visit: Payer: Medicare Other | Admitting: Radiation Oncology

## 2015-03-14 NOTE — Telephone Encounter (Signed)
Linda Singh returned my call over the weekend and left me a voicemail.  I spoke with her this morning and she is now unable to come in for her Survivorship Clinic appointment.  I offered to mail her a copy of her [place] and she agreed. I encouraged her to call me with any questions or concerns.  She told me that she is overall feeling pretty well and would prefer to have her care plan mailed to her in lieu of an in-person visit.  Therefore, I will mail her care plan to her soon.  She expressed appreciation for my call.    I will not be making any additional attempts to schedule Linda Singh in the survivorship clinic. I am happy to see her again at any time in the future if needed.   Mike Craze, NP [place] [phone]

## 2015-03-15 ENCOUNTER — Encounter: Payer: Self-pay | Admitting: Radiation Oncology

## 2015-03-16 ENCOUNTER — Ambulatory Visit
Admission: RE | Admit: 2015-03-16 | Discharge: 2015-03-16 | Disposition: A | Discharge status: Home / Self Care | Payer: Medicare Other | Source: Ambulatory Visit | Attending: Radiation Oncology | Admitting: Radiation Oncology

## 2015-03-16 ENCOUNTER — Ambulatory Visit: Payer: Medicare Other | Admitting: Radiation Oncology

## 2015-03-16 ENCOUNTER — Ambulatory Visit: Admission: RE | Admit: 2015-03-16 | Payer: Medicare Other | Source: Ambulatory Visit | Admitting: Radiation Oncology

## 2015-03-16 ENCOUNTER — Encounter: Payer: Self-pay | Admitting: Radiation Oncology

## 2015-03-16 VITALS — BP 120/91 | HR 75 | Temp 97.8°F | Ht 66.0 in | Wt 181.9 lb

## 2015-03-16 DIAGNOSIS — C50412 Malignant neoplasm of upper-outer quadrant of left female breast: Secondary | ICD-10-CM

## 2015-03-16 HISTORY — DX: Personal history of irradiation: Z92.3

## 2015-03-16 NOTE — Progress Notes (Signed)
CC: Dr. 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  She is without complaints today.  Dr. Jana Hakim started Arimidex one month ago.  She tells me she will see Dr. Barry Dienes for a follow-up visit in August.  Physical examination: Alert and oriented.There were no vitals filed for this visit.  Head and neck examination: Grossly unremarkable.  Nodes: Without palpable cervical, supraclavicular, or axillary lymphadenopathy.  Breasts: There is residual hyperpigmentation of the skin along the left breast.  There is minimal thickening.  No masses are appreciated.  Right breast without masses or lesions.  Extremities: Without edema.  Impression: Satisfactory progress.  Plan: She'll maintain follow-up with Dr. Jana Hakim.  She'll also see Dr. Barry Dienes this August.  She may wait until the end of this year or early next year for mammography.  I've not scheduled the patient for a formal follow-up visit, but I would be more than happy see her in the future should the need arise.

## 2015-03-16 NOTE — Progress Notes (Addendum)
Linda Singh here for reassessment s/p radiation therapy to her left breast. .  Note mild hyperpigmentation to the treated breast and axilla.  Denies any pain.  Reports fatigue.  Started Arimidex 1 month ago.

## 2015-03-20 NOTE — Progress Notes (Signed)
See other note from same day

## 2015-04-10 ENCOUNTER — Other Ambulatory Visit: Payer: Self-pay

## 2015-04-19 ENCOUNTER — Encounter: Payer: Self-pay | Admitting: Adult Health

## 2015-04-19 NOTE — Progress Notes (Signed)
The Survivorship Care Plan was mailed to Ms. Baird Cancer as she reported not being able to come in to the Survivorship Clinic for an in-person visit at this time. A letter was mailed to her outlining the purpose of the content of the care plan, as well as encouraging her to reach out to me with any questions or concerns.  My business card was included in the correspondence to the patient as well.  A copy of the care plan was also routed/faxed/mailed to London Pepper, MD, the patient's PCP.  I will not be placing any follow-up appointments to the Survivorship Clinic for Ms. Baird Cancer, but I am happy to see her at any time in the future for any survivorship concerns that may arise. Thank you for allowing me to participate in her care!  Mike Craze, NP [place] [phone]

## 2015-05-10 ENCOUNTER — Other Ambulatory Visit (HOSPITAL_BASED_OUTPATIENT_CLINIC_OR_DEPARTMENT_OTHER): Payer: Medicare Other

## 2015-05-10 DIAGNOSIS — C50412 Malignant neoplasm of upper-outer quadrant of left female breast: Secondary | ICD-10-CM

## 2015-05-10 LAB — COMPREHENSIVE METABOLIC PANEL (CC13)
ALK PHOS: 56 U/L (ref 40–150)
ALT: 11 U/L (ref 0–55)
ANION GAP: 7 meq/L (ref 3–11)
AST: 21 U/L (ref 5–34)
Albumin: 3.6 g/dL (ref 3.5–5.0)
BUN: 10.7 mg/dL (ref 7.0–26.0)
CO2: 26 mEq/L (ref 22–29)
CREATININE: 0.8 mg/dL (ref 0.6–1.1)
Calcium: 9.7 mg/dL (ref 8.4–10.4)
Chloride: 106 mEq/L (ref 98–109)
EGFR: 82 mL/min/{1.73_m2} — ABNORMAL LOW (ref >90)
GLUCOSE: 103 mg/dL (ref 70–140)
POTASSIUM: 3.8 meq/L (ref 3.5–5.1)
SODIUM: 139 meq/L (ref 136–145)
Total Bilirubin: 0.53 mg/dL (ref 0.20–1.20)
Total Protein: 6.4 g/dL (ref 6.4–8.3)

## 2015-05-10 LAB — CBC WITH DIFFERENTIAL/PLATELET
BASO%: 1 % (ref 0.0–2.0)
Basophils Absolute: 0 10*3/uL (ref 0.0–0.1)
EOS%: 2.4 % (ref 0.0–7.0)
Eosinophils Absolute: 0.1 10*3/uL (ref 0.0–0.5)
HEMATOCRIT: 41.2 % (ref 34.8–46.6)
HGB: 13.7 g/dL (ref 11.6–15.9)
LYMPH#: 1 10*3/uL (ref 0.9–3.3)
LYMPH%: 23.3 % (ref 14.0–49.7)
MCH: 30.3 pg (ref 25.1–34.0)
MCHC: 33.3 g/dL (ref 31.5–36.0)
MCV: 90.9 fL (ref 79.5–101.0)
MONO#: 0.4 10*3/uL (ref 0.1–0.9)
MONO%: 8.4 % (ref 0.0–14.0)
NEUT#: 2.8 10*3/uL (ref 1.5–6.5)
NEUT%: 64.9 % (ref 38.4–76.8)
Platelets: 188 10*3/uL (ref 145–400)
RBC: 4.53 10*6/uL (ref 3.70–5.45)
RDW: 13.5 % (ref 11.2–14.5)
WBC: 4.3 10*3/uL (ref 3.9–10.3)

## 2015-05-17 ENCOUNTER — Ambulatory Visit (HOSPITAL_BASED_OUTPATIENT_CLINIC_OR_DEPARTMENT_OTHER): Payer: Medicare Other | Admitting: Oncology

## 2015-05-17 VITALS — BP 121/70 | HR 73 | Temp 97.7°F | Resp 18 | Ht 66.0 in | Wt 184.4 lb

## 2015-05-17 DIAGNOSIS — Z170 Estrogen receptor positive status [ER+]: Secondary | ICD-10-CM | POA: Diagnosis not present

## 2015-05-17 DIAGNOSIS — C50412 Malignant neoplasm of upper-outer quadrant of left female breast: Secondary | ICD-10-CM

## 2015-05-17 NOTE — Progress Notes (Signed)
Linda Singh  Telephone:([phone] Fax:(336) [phone]     ID: Linda Singh DOB: Dec 23, 1940  MR#: [id number]  PIR#:[id number]  Patient Care Team: London Pepper, MD as PCP - General (Family Medicine) Stark Klein, MD as Consulting Physician (General Surgery) Arloa Koh, MD as Consulting Physician (Radiation Oncology) Chauncey Cruel, MD as Consulting Physician (Oncology) Holley Bouche, NP as Nurse Practitioner (Nurse Practitioner) Rockwell Germany, RN as Registered Nurse Mauro Kaufmann, RN as Registered Nurse OTHER MD:  CHIEF COMPLAINT: Ductal carcinoma in situ  CURRENT TREATMENT: Anastrozole   BREAST CANCER HISTORY: From the original intake note:  [name] had routine screening mammography at the [facility] 09/20/2014 going showing her breast density to be category B. In the left breast new calcifications were noted, and these were further evaluated with left diagnostic mammography 09/27/2014. The calcifications when the upper outer quadrant, and were mildly irregular and linear. Biopsy was performed 10/11/2014 and showed ([id number]) ductal carcinoma in situ, grade 2, estrogen receptor 100% positive, progesterone receptor 30% positive, both with strong staining intensity.  MRI was scheduled but the patient was unable to tolerate it.  Her subsequent history is as detailed below  INTERVAL HISTORY: Linda Singh returns today for follow-up of her early stage breast cancer. The interval history is unremarkable. She started anastrozole in May and is tolerating it well. Hot flashes and vaginal dryness are not an issue. She has not developed the arthralgias and myalgias that people can experience on that medication. She obtained a month for $15 which is not a bad Price.  REVIEW OF SYSTEMS: She has gained 6 pounds over the last several months. She is walking for 5 days a week, usually 1-2 miles, with a very reliable friend. On the other hand she is eating a fairly  high caloric diet, which is also not strong on fiber. Aside from these issues a detailed review of systems today was noncontributory  PAST MEDICAL HISTORY: Past Medical History  Diagnosis Date  . Hypertension     hx  . Wears glasses   . Wears partial dentures   . Breast cancer, left breast 11/02/14    Upper, Outer Quadrant  . S/P radiation therapy 12/29/2014 through 02/09/2015        Left breast 4600 cGy in 23 sessions left breast boost 1400 cGy in 7 sessions     PAST SURGICAL HISTORY: Past Surgical History  Procedure Laterality Date  . Abdominal hysterectomy    . Colonoscopy    . Radioactive seed guided mastectomy with axillary sentinel lymph node biopsy Left 11/02/2014    Procedure: RADIOACTIVE SEED GUIDED PARTIAL MASTECTOMY WITH AXILLARY SENTINEL LYMPH NODE BIOPSY;  Surgeon: Stark Klein, MD;  Location: [facility];  Service: General;  Laterality: Left;  . Re-excision of breast lumpectomy Left 11/17/2014    Procedure: RE-EXCISION OF LEFT BREAST LUMPECTOMY;  Surgeon: Stark Klein, MD;  Location: [facility];  Service: General;  Laterality: Left;    FAMILY HISTORY Family History  Problem Relation Age of Onset  . Heart attack Father    The patient's father died in an automobile accident at age 107. The patient's mother died at age 66. The patient had 2 brothers, 6 sisters. There is no history of breast or ovarian cancer in the family.  GYNECOLOGIC HISTORY:  No LMP recorded. Patient is postmenopausal. Menarche age 24. First live birth age 40. The patient is GX P3. She status post total abdominal hysterectomy with bilateral salpingo-oophorectomy. She did  not take hormone replacement. 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 Her son [name] works as a Psychologist, sport and exercise in  [place]. Her son [name] is Quarry manager. of [employer] in [place]. The patient has 3 grandchildren. She attends [employer]    ADVANCED DIRECTIVES: Not in place   HEALTH MAINTENANCE: History  Substance Use Topics  . Smoking status: Never Smoker   . Smokeless tobacco: Never Used  . Alcohol Use: No     Colonoscopy: 2015/[facility]  PAP: Status post hysterectomy  Bone density:  Lipid panel:  Allergies  Allergen Reactions  . Amoxicillin     Does not know  . Contrast Media [Iodinated Diagnostic Agents] Nausea And Vomiting    Current Outpatient Prescriptions  Medication Sig Dispense Refill  . anastrozole (ARIMIDEX) 1 MG tablet Take 1 tablet (1 mg total) by mouth daily. 30 tablet 12  . calcium carbonate (OS-CAL) 600 MG TABS tablet Take 600 mg by mouth 2 (two) times daily with a meal.    . hydrochlorothiazide (HYDRODIURIL) 25 MG tablet Take 25 mg by mouth daily.    Marland Kitchen ibuprofen (ADVIL,MOTRIN) 200 MG tablet Take 200 mg by mouth every 6 (six) hours as needed.    . non-metallic deodorant Jethro Poling) MISC Apply 1 application topically daily as needed.    Marland Kitchen omega-3 acid ethyl esters (LOVAZA) 1 G capsule Take 1 g by mouth daily.     No current facility-administered medications for this visit.    OBJECTIVE: Older African-American woman who appears younger than stated age 74 Vitals:   05/17/15 1458  BP: 121/70  Pulse: 73  Temp: 97.7 F (36.5 C)  Resp: 18     Body mass index is 29.78 kg/(m^2).    ECOG FS:0 - Asymptomatic  Sclerae unicteric, EOMs intact Oropharynx clear, dentition in good repair No cervical or supraclavicular adenopathy Lungs no rales or rhonchi Heart regular rate and rhythm Abd soft, nontender, positive bowel sounds MSK no focal spinal tenderness, no upper extremity lymphedema Neuro: nonfocal, well oriented, appropriate affect Breasts: The right breast is unremarkable. The left breast is status post lumpectomy and radiation. There is  still mild hyperpigmentation. There is no evidence of local recurrence. The left axilla is benign.   LAB RESULTS:  CMP     Component Value Date/Time   NA 139 05/10/2015 1057   K 3.8 05/10/2015 1057   CO2 26 05/10/2015 1057   GLUCOSE 103 05/10/2015 1057   BUN 10.7 05/10/2015 1057   CREATININE 0.8 05/10/2015 1057   CALCIUM 9.7 05/10/2015 1057   PROT 6.4 05/10/2015 1057   ALBUMIN 3.6 05/10/2015 1057   AST 21 05/10/2015 1057   ALT 11 05/10/2015 1057   ALKPHOS 56 05/10/2015 1057   BILITOT 0.53 05/10/2015 1057    INo results found for: SPEP, UPEP  Lab Results  Component Value Date   WBC 4.3 05/10/2015   NEUTROABS 2.8 05/10/2015   HGB 13.7 05/10/2015   HCT 41.2 05/10/2015   MCV 90.9 05/10/2015   PLT 188 05/10/2015      Chemistry      Component Value Date/Time   NA 139 05/10/2015 1057   K 3.8 05/10/2015 1057   CO2 26 05/10/2015 1057   BUN 10.7 05/10/2015 1057   CREATININE 0.8 05/10/2015 1057      Component Value Date/Time   CALCIUM 9.7 05/10/2015 1057  ALKPHOS 56 05/10/2015 1057   AST 21 05/10/2015 1057   ALT 11 05/10/2015 1057   BILITOT 0.53 05/10/2015 1057       No results found for: LABCA2  No components found for: [id number]  No results for input(s): INR in the last 168 hours.  Urinalysis No results found for: COLORURINE, APPEARANCEUR, LABSPEC, PHURINE, GLUCOSEU, HGBUR, BILIRUBINUR, KETONESUR, PROTEINUR, UROBILINOGEN, NITRITE, LEUKOCYTESUR  STUDIES: CLINICAL DATA: Status post left lumpectomy for ductal carcinoma in situ with re-excision for positive margins. Pre radiation therapy evaluation.  EXAM: DIGITAL DIAGNOSTIC LEFT MAMMOGRAM WITH CAD  COMPARISON: Previous examinations.  ACR Breast Density Category b: There are scattered areas of fibroglandular density.  FINDINGS: Interval post lumpectomy changes on the left with a new oval density in the central aspect of the breast, centered slightly superiorly, measuring 6 x 6 x 6 cm in maximum  dimensions. Interval diffuse anterior and inferior skin thickening. This is more pronounced at the lumpectomy site, with indentation of the skin at that location. No residual microcalcifications are seen.  Mammographic images were processed with CAD.  IMPRESSION: Interval post lumpectomy changes on the left with a 6 cm postsurgical seroma in the lumpectomy bed. No residual calcifications or new findings suspicious for malignancy.  RECOMMENDATION: 1. Treatment plan. 2. Bilateral diagnostic mammogram in 9 months. That will be 1 year since mammographic evaluation of the right breast.  I have discussed the findings and recommendations with the patient. Results were also provided in writing at the conclusion of the visit. If applicable, a reminder letter will be sent to the patient regarding the next appointment.  BI-RADS CATEGORY 2: Benign.  Electronically Signed  By: Claudie Revering M.D.  On: 12/19/2014 11:09   ASSESSMENT: 74 y.o. [place] woman status post left breast upper outer quadrant biopsy 10/11/2014 showing ductal carcinoma in situ, grade 2, estrogen and progesterone receptor positive  (1) status post left lumpectomy and sentinel lymph node sampling 11/02/2014 for a pT1a pN0, stage IA invasive ductal carcinoma, grade 2, estrogen receptor 100% positive, progesterone receptor 76% positive, with an MIB-1 of 17%, and no HER-2 amplification. Margins were focally involved by ductal carcinoma in situ  (a) additional surgery 11/17/2014 showed DCIS still close but now negative margins   (2) radiation oncology 12/29/2014 through 02/09/2015:   Left breast 4600 cGy in 23 sessions left breast boost 1400 cGy in 7 sessions   (3) anastrozole started 02/16/2015  (a) bone density February 2015 was normal.  PLAN: Linda Singh is tolerating the anastrozole with no significant side effects. She is gaining some weight but I  don't think the anastrozole is going to be the cause of that.  We discussed her diet in detail. She does like sweets quite a bit and she has been having biscuits and sausage 2 for breakfast which I think may be a better explanation for the weight gain in the anastrozole.  I strongly encouraged her to eat lots of vegetables and some meat, cut back on the carbohydrates, and have a small dessert. I think if she does that and continues to walk as much as she is doing she will do well in the long-term.  She will have her next mammogram in December. I'm going to see her again in February and we will start seeing her on a once a year basis from that point until she completes her 5 years of anastrozole. She will also be due for repeat bone density sometime early next year  Chauncey Cruel, MD   05/17/2015  3:09 PM Medical Oncology and Hematology [facility] [address] [place], [place] [address] Tel. [phone]    Fax. [phone]

## 2015-05-23 ENCOUNTER — Other Ambulatory Visit: Payer: Medicare Other

## 2015-05-30 ENCOUNTER — Ambulatory Visit: Payer: Medicare Other | Admitting: Oncology

## 2015-06-13 DIAGNOSIS — C50412 Malignant neoplasm of upper-outer quadrant of left female breast: Secondary | ICD-10-CM | POA: Diagnosis not present

## 2015-06-13 DIAGNOSIS — I890 Lymphedema, not elsewhere classified: Secondary | ICD-10-CM | POA: Diagnosis not present

## 2015-06-15 ENCOUNTER — Telehealth: Payer: Self-pay | Admitting: *Deleted

## 2015-06-15 DIAGNOSIS — C50412 Malignant neoplasm of upper-outer quadrant of left female breast: Secondary | ICD-10-CM

## 2015-06-15 MED ORDER — ANASTROZOLE 1 MG PO TABS: 1.0000 mg | ORAL_TABLET | Freq: Every day | ORAL | Status: DC

## 2015-06-15 NOTE — Telephone Encounter (Signed)
"  I saw Dr. Jana Hakim on August 3rd and was to have anastrozole changed to a 90-day supply.  What I picked up yesterday was only a 30-day supply."  This nurse will send new order to [place].

## 2015-06-27 ENCOUNTER — Ambulatory Visit: Payer: Medicare Other | Attending: General Surgery | Admitting: Physical Therapy

## 2015-06-27 DIAGNOSIS — I890 Lymphedema, not elsewhere classified: Secondary | ICD-10-CM

## 2015-06-27 NOTE — Therapy (Signed)
[place], [place], [address] Phone: [phone]   Fax:  [phone]  Physical Therapy Evaluation  Patient Details  Name: Linda Singh MRN: [id number] Date of Birth: 02-21-1941 Referring Provider:  Stark Klein, MD  Encounter Date: 06/27/2015      PT End of Session - 06/27/15 2047    Visit Number 1   Number of Visits 5   Date for PT Re-Evaluation 07/27/15   PT Start Time 1300   PT Stop Time 1345   PT Time Calculation (min) 45 min   Activity Tolerance Patient tolerated treatment well   Behavior During Therapy [facility] for tasks assessed/performed      Past Medical History  Diagnosis Date  . Hypertension     hx  . Wears glasses   . Wears partial dentures   . Breast cancer, left breast 11/02/14    Upper, Outer Quadrant  . S/P radiation therapy 12/29/2014 through 02/09/2015        Left breast 4600 cGy in 23 sessions left breast boost 1400 cGy in 7 sessions     Past Surgical History  Procedure Laterality Date  . Abdominal hysterectomy    . Colonoscopy    . Radioactive seed guided mastectomy with axillary sentinel lymph node biopsy Left 11/02/2014    Procedure: RADIOACTIVE SEED GUIDED PARTIAL MASTECTOMY WITH AXILLARY SENTINEL LYMPH NODE BIOPSY;  Surgeon: Stark Klein, MD;  Location: [place];  Service: General;  Laterality: Left;  . Re-excision of breast lumpectomy Left 11/17/2014    Procedure: RE-EXCISION OF LEFT BREAST LUMPECTOMY;  Surgeon: Stark Klein, MD;  Location: [facility];  Service: General;  Laterality: Left;    There were no vitals filed for this visit.  Visit Diagnosis:  Lymphedema of breast - Plan: PT plan of care cert/re-cert      Subjective Assessment - 06/27/15 1308    Subjective Having some swelling in the skin of left breast.   Pertinent History Left breast cancer diagnosed in about December  2015; lumpectomy 11/02/14.  11/17/13 had to remove margins.  Negative lymph nodes; pt. unsure how many removed.  Radiation completed  end of April 2016.  Now on Arimidex.  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  Tendinitis in left thumb; splint has irritated her skin.  Saw an orthopedist  about that.     Patient Stated Goals not have fluid in skin   Currently in Pain? Yes   Pain Score 5    Pain Location Axilla   Pain Orientation Left   Pain Descriptors / Indicators Sore   Pain Frequency Constant   Aggravating Factors  nothing   Pain Relieving Factors nothing            [facility] PT Assessment - 06/27/15 0001    Assessment   Medical Diagnosis left breast cancer   Onset Date/Surgical Date 11/02/14  and 11/17/14   Precautions   Precautions Other (comment)   Precaution Comments cancer precautions   Restrictions   Weight Bearing Restrictions No   Balance Screen   Has the patient fallen in the past 6 months No   Has the patient had a decrease in activity level because of a fear of falling?  No   Home Environment   Living Environment Private residence   Living Arrangements Other (Comment)  not reported whom, but someone with her   Type of [place] Two level   Prior Function   Level of Independence  Independent   IT trainer work   Leisure walks daily 2.5 miles 6 days a week   Cognition   Overall Cognitive Status Within Functional Limits for tasks assessed   Observation/Other Assessments   Observations left breast is fuller at superior aspect and firmer feeling than right   Skin Integrity intact; lumpectomy incision healed   Other Surveys  --  LLIS score 20 = 29% impairment   ROM / Strength   AROM / PROM / Strength AROM   AROM   AROM Assessment Site Shoulder   Right/Left Shoulder Right;Left   Right Shoulder Flexion 151 Degrees   Right Shoulder ABduction 180 Degrees   Right Shoulder Internal Rotation 56 Degrees   Right Shoulder External Rotation 100 Degrees    Left Shoulder Flexion 147 Degrees   Left Shoulder ABduction 155 Degrees   Left Shoulder Internal Rotation 80 Degrees   Left Shoulder External Rotation 95 Degrees           LYMPHEDEMA/ONCOLOGY QUESTIONNAIRE - 06/27/15 1322    Type   Cancer Type left breast   Surgeries   Lumpectomy Date 11/02/14   Sentinel Lymph Node Biopsy Date 11/02/14   What other symptoms do you have   Are you Having Heaviness or Tightness Yes   Lymphedema Assessments   Lymphedema Assessments Upper extremities   Right Upper Extremity Lymphedema   10 cm Proximal to Olecranon Process 29.4 cm   Olecranon Process 25.1 cm   10 cm Proximal to Ulnar Styloid Process 21.5 cm   Just Proximal to Ulnar Styloid Process 16.6 cm   Across Hand at [employer] 20.6 cm   At [place] of 2nd Digit 6.8 cm   Left Upper Extremity Lymphedema   10 cm Proximal to Olecranon Process 29.7 cm   Olecranon Process 25 cm   10 cm Proximal to Ulnar Styloid Process 21.1 cm   Just Proximal to Ulnar Styloid Process 16.6 cm   Across Hand at [employer] 20.4 cm   At [place] of 2nd Digit 6.6 cm                [facility] Adult PT Treatment/Exercise - 06/27/15 0001    Self-Care   Self-Care Other Self-Care Comments   Other Self-Care Comments  Gave patient 1/2 inch gray foam rectangle in stockinette to use for compression between skin and bra; suggested trying different bras including sports bras or other more compressive bras; showed patient manufactured breast compression pads; gave lymphedema info pamphlet; briefly mentioned pumps.                        [facility] Goals - 06/27/15 2053    CC Long Term Goal  #1   Title Pt. will be knowledgeable about self-management of breast lymphedema and lymphedema risk reduction.   Time 4   Period Weeks   Status New   CC Long Term Goal  #2   Title Pt. will be independent in performing self-manual lymph drainage correctly.   Time 4   Period Weeks   Status New             Plan - 06/27/15 2047    Clinical Impression Statement This is a pleasant woman with apparent left breast lymphedema, s/p lumpectomy and radiation for breast cancer, here for assistance to minimize and manage this.  She would like to focus on self-care in her therapy visits.   Pt will benefit from skilled therapeutic intervention in  order to improve on the following deficits Increased edema;Decreased knowledge of precautions;Decreased knowledge of use of DME   Rehab Potential Good   PT Frequency --  up to four visits total   PT Duration 4 weeks   PT Treatment/Interventions Manual lymph drainage;Patient/family education;ADLs/Self Care Home Management;DME Instruction   PT Next Visit Plan Begin manual lymph drainage for left breast and instruction in same; continue discussion of compression bras; assess benefit of foam paid.   Consulted and Agree with Plan of Care Patient          G-Codes - 07/15/2015 January 30, 2058    Functional Assessment Tool Used Lymphedema life impact scale   Functional Limitation Self care   Self Care Current Status [id number]) At least 20 percent but less than 40 percent impaired, limited or restricted   Self Care Goal Status (G9021) At least 1 percent but less than 20 percent impaired, limited or restricted       Problem List Patient Active Problem List   Diagnosis Date Noted  . Breast cancer of upper-outer quadrant of left female breast 10/13/2014    Valeen Borys July 15, 2015, 9:02 PM  [place], [place], [address] Phone: [phone]   Fax:  [place], PT 07-15-2015 9:02 PM

## 2015-07-11 ENCOUNTER — Ambulatory Visit: Payer: Medicare Other | Admitting: Physical Therapy

## 2015-07-11 DIAGNOSIS — I890 Lymphedema, not elsewhere classified: Secondary | ICD-10-CM

## 2015-07-11 NOTE — Patient Instructions (Signed)
Abdominal Breathing Lying Down   Place one hand on stomach. Breathe in slowly through nose, letting stomach rise. Breathe out gently through pursed lips, letting stomach fall. Breathe out for at least twice as long as you breathe in. Repeat __10__ times, __2__ times daily.  Copyright  VHI. All rights reserved.

## 2015-07-11 NOTE — Therapy (Signed)
[place], [place], [address] Phone: [phone]   Fax:  [phone]  Physical Therapy Treatment  Patient Details  Name: Linda Singh MRN: [id number] Date of Birth: May 13, 1941 Referring Provider:  Stark Klein, MD  Encounter Date: 07/11/2015      PT End of Session - 07/11/15 1634    Visit Number 2   Number of Visits 5   Date for PT Re-Evaluation 07/27/15   PT Start Time 1300   PT Stop Time 1345   PT Time Calculation (min) 45 min   Activity Tolerance Patient tolerated treatment well   Behavior During Therapy [facility] for tasks assessed/performed      Past Medical History  Diagnosis Date  . Hypertension     hx  . Wears glasses   . Wears partial dentures   . Breast cancer, left breast 11/02/14    Upper, Outer Quadrant  . S/P radiation therapy 12/29/2014 through 02/09/2015        Left breast 4600 cGy in 23 sessions left breast boost 1400 cGy in 7 sessions     Past Surgical History  Procedure Laterality Date  . Abdominal hysterectomy    . Colonoscopy    . Radioactive seed guided mastectomy with axillary sentinel lymph node biopsy Left 11/02/2014    Procedure: RADIOACTIVE SEED GUIDED PARTIAL MASTECTOMY WITH AXILLARY SENTINEL LYMPH NODE BIOPSY;  Surgeon: Stark Klein, MD;  Location: [facility];  Service: General;  Laterality: Left;  . Re-excision of breast lumpectomy Left 11/17/2014    Procedure: RE-EXCISION OF LEFT BREAST LUMPECTOMY;  Surgeon: Stark Klein, MD;  Location[facility];  Service: General;  Laterality: Left;    There were no vitals filed for this visit.  Visit Diagnosis:  Lymphedema of breast      Subjective Assessment - 07/11/15 1302    Subjective Left thumb is bothering her with the tendinitis.  Nothing new.  I've been wearing that pad and I've got on a sports bra.   Currently in Pain? No/denies   still sore at left lymph node incision                         [facility] Adult PT Treatment/Exercise - 07/11/15 0001    Self-Care   Other Self-Care Comments  Time spent to instruct patient in diaphragmatic breathing (she had some difficulty with this).   Manual Therapy   Manual Therapy Manual Lymphatic Drainage (MLD)   Manual Lymphatic Drainage (MLD) Verbal instruction in self-manual lymph drainage before and during performing the following in supine:  diaphragmatic breathing, short neck, right axilla and anterior interaxillary anastomosis, left groin and axillo-inguinal anastomosis, and left breast, directing towards pathways.                PT Education - 07/11/15 1633    Education provided Yes   Education Details began instruction in self manual lymph drainage for left breast   Person(s) Educated Patient   Methods Explanation;Demonstration;Other (comment)  loaned DVD   Comprehension Need further instruction  Patient was asked NOT to start this until we have had her do it in clinic at next visit                [facility] - 07/11/15 1652    CC Long Term Goal  #1   Title Pt. will be knowledgeable about self-management of breast lymphedema and lymphedema risk reduction.   Status On-going  CC Long Term Goal  #2   Title Pt. will be independent in performing self-manual lymph drainage correctly.   Status On-going            Plan - 07/11/15 5945    Clinical Impression Statement Just starting therapy today:  patient tolerated manual lymph drainage well and was attentive to instruction about it.  She is not sure if the foam pad helped anything.   Pt will benefit from skilled therapeutic intervention in order to improve on the following deficits Increased edema;Decreased knowledge of precautions;Decreased knowledge of use of DME   Rehab Potential Good   PT Frequency --  up to 4 visits total   PT Duration 4 weeks   PT  Treatment/Interventions Manual lymph drainage;Patient/family education;ADLs/Self Care Home Management   PT Next Visit Plan Have patient perform manual lymph drainage with instruction and cueing; further discussion of compression bras.   Consulted and Agree with Plan of Care Patient        Problem List Patient Active Problem List   Diagnosis Date Noted  . Breast cancer of upper-outer quadrant of left female breast 10/13/2014    SALISBURY,DONNA 07/11/2015, 4:53 PM  [place], [place], [address] Phone: [phone]   Fax:  [place], PT 07/11/2015 4:53 PM

## 2015-07-18 ENCOUNTER — Ambulatory Visit: Payer: Medicare Other | Attending: General Surgery | Admitting: Physical Therapy

## 2015-07-18 DIAGNOSIS — I890 Lymphedema, not elsewhere classified: Secondary | ICD-10-CM | POA: Diagnosis not present

## 2015-07-18 NOTE — Therapy (Addendum)
[place], [place], [address] Phone: [phone]   Fax:  [phone]  Physical Therapy Treatment  Patient Details  Name: Linda Singh MRN: [id number] Date of Birth: September 10, 1941 Referring Provider:  Stark Klein, MD  Encounter Date: 07/18/2015    Past Medical History  Diagnosis Date  . Hypertension     hx  . Wears glasses   . Wears partial dentures   . Breast cancer, left breast 11/02/14    Upper, Outer Quadrant  . S/P radiation therapy 12/29/2014 through 02/09/2015        Left breast 4600 cGy in 23 sessions left breast boost 1400 cGy in 7 sessions     Past Surgical History  Procedure Laterality Date  . Abdominal hysterectomy    . Colonoscopy    . Radioactive seed guided mastectomy with axillary sentinel lymph node biopsy Left 11/02/2014    Procedure: RADIOACTIVE SEED GUIDED PARTIAL MASTECTOMY WITH AXILLARY SENTINEL LYMPH NODE BIOPSY;  Surgeon: Stark Klein, MD;  Location: [facility];  Service: General;  Laterality: Left;  . Re-excision of breast lumpectomy Left 11/17/2014    Procedure: RE-EXCISION OF LEFT BREAST LUMPECTOMY;  Surgeon: Stark Klein, MD;  Location[facility];  Service: General;  Laterality: Left;    There were no vitals filed for this visit.  Visit Diagnosis:  Lymphedema of breast                                       [facility] Goals - 07/18/15 1428    CC Long Term Goal  #1   Title Pt. will be knowledgeable about self-management of breast lymphedema and lymphedema risk reduction.   Status Partially Met   CC Long Term Goal  #2   Title Pt. will be independent in performing self-manual lymph drainage correctly.   Status Partially Met            Problem List Patient Active Problem List   Diagnosis Date Noted  . Breast cancer of upper-outer quadrant of  left female breast ([place]) 10/13/2014    SALISBUR[place],DONNA 12/21/2015, 5:24 PM  [place], [place], [address] Phone: [phone]   Fax:  [phone]     Serafina Royals, PT 12/21/2015 5:24 PM  PHYSICAL THERAPY DISCHARGE SUMMARY  Visits from Start of Care: 3  Current functional level related to goals / functional outcomes: Goals partially met.   Remaining deficits: Swelling will continue to require management, which patient should be able to do independently.  She was to return to therapy for further review and cementing her knowledge about how to do this, but she did not finish the last two sessions.   Education / Equipment: Self manual lymph drainage, use of compression bra. Plan: Patient agrees to discharge.  Patient goals were partially met. Patient is being discharged due to not returning since the last visit.  ?????    Serafina Royals, PT 12/21/2015 5:24 PM

## 2015-07-18 NOTE — Patient Instructions (Signed)

## 2015-07-25 ENCOUNTER — Ambulatory Visit: Payer: Medicare Other | Admitting: Physical Therapy

## 2015-07-26 ENCOUNTER — Ambulatory Visit: Payer: Medicare Other

## 2015-07-27 ENCOUNTER — Ambulatory Visit: Payer: Medicare Other | Admitting: Physical Therapy

## 2015-08-01 ENCOUNTER — Ambulatory Visit: Payer: Medicare Other | Admitting: Physical Therapy

## 2015-09-12 DIAGNOSIS — C50412 Malignant neoplasm of upper-outer quadrant of left female breast: Secondary | ICD-10-CM | POA: Diagnosis not present

## 2015-09-12 DIAGNOSIS — I890 Lymphedema, not elsewhere classified: Secondary | ICD-10-CM | POA: Diagnosis not present

## 2015-09-22 ENCOUNTER — Ambulatory Visit
Admission: RE | Admit: 2015-09-22 | Discharge: 2015-09-22 | Disposition: A | Discharge status: Home / Self Care | Payer: Medicare Other | Source: Ambulatory Visit | Attending: Oncology | Admitting: Oncology

## 2015-09-22 DIAGNOSIS — R928 Other abnormal and inconclusive findings on diagnostic imaging of breast: Secondary | ICD-10-CM | POA: Diagnosis not present

## 2015-09-22 DIAGNOSIS — C50412 Malignant neoplasm of upper-outer quadrant of left female breast: Secondary | ICD-10-CM

## 2015-10-03 ENCOUNTER — Telehealth: Payer: Self-pay | Admitting: Oncology

## 2015-10-03 NOTE — Telephone Encounter (Signed)
Called and request to move her labs  anne

## 2015-11-20 ENCOUNTER — Other Ambulatory Visit (HOSPITAL_BASED_OUTPATIENT_CLINIC_OR_DEPARTMENT_OTHER): Payer: Medicare Other

## 2015-11-20 ENCOUNTER — Other Ambulatory Visit: Payer: Medicare Other

## 2015-11-20 DIAGNOSIS — C50412 Malignant neoplasm of upper-outer quadrant of left female breast: Secondary | ICD-10-CM

## 2015-11-20 LAB — COMPREHENSIVE METABOLIC PANEL
ALT: 11 U/L (ref 0–55)
AST: 22 U/L (ref 5–34)
Albumin: 3.9 g/dL (ref 3.5–5.0)
Alkaline Phosphatase: 53 U/L (ref 40–150)
Anion Gap: 9 mEq/L (ref 3–11)
BILIRUBIN TOTAL: 0.78 mg/dL (ref 0.20–1.20)
BUN: 11.5 mg/dL (ref 7.0–26.0)
CO2: 25 mEq/L (ref 22–29)
Calcium: 9.6 mg/dL (ref 8.4–10.4)
Chloride: 106 mEq/L (ref 98–109)
Creatinine: 0.9 mg/dL (ref 0.6–1.1)
EGFR: 76 mL/min/{1.73_m2} — ABNORMAL LOW (ref >90)
GLUCOSE: 106 mg/dL (ref 70–140)
POTASSIUM: 3.5 meq/L (ref 3.5–5.1)
SODIUM: 140 meq/L (ref 136–145)
TOTAL PROTEIN: 7 g/dL (ref 6.4–8.3)

## 2015-11-20 LAB — CBC WITH DIFFERENTIAL/PLATELET
BASO%: 0.4 % (ref 0.0–2.0)
Basophils Absolute: 0 10*3/uL (ref 0.0–0.1)
EOS ABS: 0.1 10*3/uL (ref 0.0–0.5)
EOS%: 1.7 % (ref 0.0–7.0)
HCT: 41.2 % (ref 34.8–46.6)
HGB: 13.9 g/dL (ref 11.6–15.9)
LYMPH%: 26.7 % (ref 14.0–49.7)
MCH: 30.5 pg (ref 25.1–34.0)
MCHC: 33.7 g/dL (ref 31.5–36.0)
MCV: 90.4 fL (ref 79.5–101.0)
MONO#: 0.3 10*3/uL (ref 0.1–0.9)
MONO%: 6.5 % (ref 0.0–14.0)
NEUT%: 64.7 % (ref 38.4–76.8)
NEUTROS ABS: 3.4 10*3/uL (ref 1.5–6.5)
Platelets: 187 10*3/uL (ref 145–400)
RBC: 4.56 10*6/uL (ref 3.70–5.45)
RDW: 13.4 % (ref 11.2–14.5)
WBC: 5.2 10*3/uL (ref 3.9–10.3)
lymph#: 1.4 10*3/uL (ref 0.9–3.3)

## 2015-11-27 ENCOUNTER — Ambulatory Visit (HOSPITAL_BASED_OUTPATIENT_CLINIC_OR_DEPARTMENT_OTHER): Payer: Medicare Other | Admitting: Oncology

## 2015-11-27 ENCOUNTER — Telehealth: Payer: Self-pay | Admitting: Oncology

## 2015-11-27 VITALS — BP 128/87 | HR 72 | Temp 97.9°F | Resp 18 | Ht 66.0 in | Wt 183.4 lb

## 2015-11-27 DIAGNOSIS — C50412 Malignant neoplasm of upper-outer quadrant of left female breast: Secondary | ICD-10-CM

## 2015-11-27 DIAGNOSIS — G4700 Insomnia, unspecified: Secondary | ICD-10-CM | POA: Diagnosis not present

## 2015-11-27 DIAGNOSIS — R351 Nocturia: Secondary | ICD-10-CM | POA: Diagnosis not present

## 2015-11-27 MED ORDER — ANASTROZOLE 1 MG PO TABS: 1.0000 mg | ORAL_TABLET | Freq: Every day | ORAL | Status: DC

## 2015-11-27 NOTE — Telephone Encounter (Signed)
Appointments made and avs printed °

## 2015-11-27 NOTE — Progress Notes (Signed)
[facility]  Telephone:(336) [phone] Fax:(336) [phone]     ID: Linda Singh DOB: [birth year]  MR#: [id number]  IAX#:[id number]  Patient Care Team: London Pepper, MD as PCP - General (Family Medicine) Stark Klein, MD as Consulting Physician (General Surgery) Arloa Koh, MD as Consulting Physician (Radiation Oncology) Chauncey Cruel, MD as Consulting Physician (Oncology) Holley Bouche, NP as Nurse Practitioner (Nurse Practitioner) Rockwell Germany, RN as Registered Nurse Mauro Kaufmann, RN as Registered Nurse OTHER MD:  CHIEF COMPLAINT: Ductal carcinoma in situ  CURRENT TREATMENT: Anastrozole   BREAST CANCER HISTORY: From the original intake note:  Linda Singh had routine screening mammography at the [facility] 09/20/2014 going showing her breast density to be category B. In the left breast new calcifications were noted, and these were further evaluated with left diagnostic mammography 09/27/2014. The calcifications when the upper outer quadrant, and were mildly irregular and linear. Biopsy was performed 10/11/2014 and showed ([id number]) ductal carcinoma in situ, grade 2, estrogen receptor 100% positive, progesterone receptor 30% positive, both with strong staining intensity.  MRI was scheduled but the patient was unable to tolerate it.  Her subsequent history is as detailed below  INTERVAL HISTORY: Linda Singh returns today for follow-up of her estrogen receptor positive breast cancer. She continues on anastrozole. She tolerates this well. Hot flashes and vaginal dryness are not major concerns. Currently she is pain on dollars per month for this pill.  REVIEW OF SYSTEMS: She complains of insomnia. She wakes up 3 or 4 times a night. Each time she has to urinate. She does not have night sweats. She is not exercising regularly. She used to walk up to 3 miles today with a friend but she is not doing that at present. Aside from these issues a detailed review of  systems today was noncontributory  PAST MEDICAL HISTORY: Past Medical History  Diagnosis Date  . Hypertension     hx  . Wears glasses   . Wears partial dentures   . Breast cancer, left breast 11/02/14    Upper, Outer Quadrant  . S/P radiation therapy 12/29/2014 through 02/09/2015        Left breast 4600 cGy in 23 sessions left breast boost 1400 cGy in 7 sessions     PAST SURGICAL HISTORY: Past Surgical History  Procedure Laterality Date  . Abdominal hysterectomy    . Colonoscopy    . Radioactive seed guided mastectomy with axillary sentinel lymph node biopsy Left 11/02/2014    Procedure: RADIOACTIVE SEED GUIDED PARTIAL MASTECTOMY WITH AXILLARY SENTINEL LYMPH NODE BIOPSY;  Surgeon: Stark Klein, MD;  Location: [facility];  Service: General;  Laterality: Left;  . Re-excision of breast lumpectomy Left 11/17/2014    Procedure: RE-EXCISION OF LEFT BREAST LUMPECTOMY;  Surgeon: Stark Klein, MD;  Location: [facility];  Service: General;  Laterality: Left;    FAMILY HISTORY Family History  Problem Relation Age of Onset  . Heart attack Father    The patient's father died in an automobile accident at age 15. The patient's mother died at age 49. The patient had 2 brothers, 6 sisters. There is no history of breast or ovarian cancer in the family.  GYNECOLOGIC HISTORY:  No LMP recorded. Patient is postmenopausal. Menarche age 38. First live birth age 61. The patient is GX P3. She status post total abdominal hysterectomy with bilateral salpingo-oophorectomy. She did not take hormone replacement. 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 Her son [name] works as a Psychologist, sport and exercise in [place]. Her son [name] is Quarry manager. of [employer] in [place]. The patient  has 3 grandchildren. She attends [employer]    ADVANCED DIRECTIVES: Not in place   HEALTH MAINTENANCE: Social History  Substance Use Topics  . Smoking status: Never Smoker   . Smokeless tobacco: Never Used  . Alcohol Use: No     Colonoscopy: 2015/[facility]  PAP: Status post hysterectomy  Bone density:  Lipid panel:  Allergies  Allergen Reactions  . Amoxicillin     Does not know  . Contrast Media [Iodinated Diagnostic Agents] Nausea And Vomiting    Current Outpatient Prescriptions  Medication Sig Dispense Refill  . anastrozole (ARIMIDEX) 1 MG tablet Take 1 tablet (1 mg total) by mouth daily. 90 tablet 3  . calcium carbonate (OS-CAL) 600 MG TABS tablet Take 600 mg by mouth 2 (two) times daily with a meal.    . hydrochlorothiazide (HYDRODIURIL) 25 MG tablet Take 25 mg by mouth daily.    Marland Kitchen ibuprofen (ADVIL,MOTRIN) 200 MG tablet Take 200 mg by mouth every 6 (six) hours as needed.    . non-metallic deodorant Jethro Poling) MISC Apply 1 application topically daily as needed.    Marland Kitchen omega-3 acid ethyl esters (LOVAZA) 1 G capsule Take 1 g by mouth daily.     No current facility-administered medications for this visit.    OBJECTIVE: Older African-American woman in no acute distress Filed Vitals:   11/27/15 1313  BP: 128/87  Pulse: 72  Temp: 97.9 F (36.6 C)  Resp: 18     Body mass index is 29.62 kg/(m^2).    ECOG FS:0 - Asymptomatic  Sclerae unicteric, pupils round and equal Oropharynx clear and moist-- no thrush or other lesions No cervical or supraclavicular adenopathy Lungs no rales or rhonchi Heart regular rate and rhythm Abd soft, nontender, positive bowel sounds MSK no focal spinal tenderness, no upper extremity lymphedema Neuro: nonfocal, well oriented, appropriate affect Breasts: The right breast is unremarkable. The left breast is status post lumpectomy and radiation. There is the expected thickening of the skin and mild hyperpigmentation. There is no evidence of local  recurrence. Left axilla is benign.    LAB RESULTS:  CMP     Component Value Date/Time   NA 140 11/20/2015 1345   K 3.5 11/20/2015 1345   CO2 25 11/20/2015 1345   GLUCOSE 106 11/20/2015 1345   BUN 11.5 11/20/2015 1345   CREATININE 0.9 11/20/2015 1345   CALCIUM 9.6 11/20/2015 1345   PROT 7.0 11/20/2015 1345   ALBUMIN 3.9 11/20/2015 1345   AST 22 11/20/2015 1345   ALT 11 11/20/2015 1345   ALKPHOS 53 11/20/2015 1345   BILITOT 0.78 11/20/2015 1345    INo results found for: SPEP, UPEP  Lab Results  Component Value Date   WBC 5.2 11/20/2015   NEUTROABS 3.4 11/20/2015   HGB 13.9 11/20/2015   HCT 41.2 11/20/2015   MCV 90.4 11/20/2015   PLT 187 11/20/2015      Chemistry      Component Value Date/Time   NA 140 11/20/2015 1345   K 3.5 11/20/2015 1345   CO2 25 11/20/2015 1345   BUN 11.5 11/20/2015 1345   CREATININE 0.9 11/20/2015 1345      Component Value Date/Time   CALCIUM 9.6 11/20/2015 1345   ALKPHOS 53 11/20/2015 1345  AST 22 11/20/2015 1345   ALT 11 11/20/2015 1345   BILITOT 0.78 11/20/2015 1345       No results found for: LABCA2  No components found for: LABCA125  No results for input(s): INR in the last 168 hours.  Urinalysis No results found for: COLORURINE, APPEARANCEUR, LABSPEC, [place], GLUCOSEU, HGBUR, BILIRUBINUR, KETONESUR, PROTEINUR, UROBILINOGEN, NITRITE, LEUKOCYTESUR  STUDIES: CLINICAL DATA: History of left breast cancer status post lumpectomy 2015.  EXAM: DIGITAL DIAGNOSTIC BILATERAL MAMMOGRAM WITH 3D TOMOSYNTHESIS AND CAD  COMPARISON: Previous exam(s).  ACR Breast Density Category b: There are scattered areas of fibroglandular density.  FINDINGS: Lumpectomy changes are seen in the left breast. There is no suspicious mass or malignant type microcalcifications in either breast.  Mammographic images were processed with CAD.  IMPRESSION: No evidence of malignancy in either breast.  RECOMMENDATION: Bilateral diagnostic  mammogram in 1 year is recommended.  I have discussed the findings and recommendations with the patient. Results were also provided in writing at the conclusion of the visit. If applicable, a reminder letter will be sent to the patient regarding the next appointment.  BI-RADS CATEGORY 2: Benign.   Electronically Signed  By: Lillia Mountain M.D.  On: 09/22/2015 13:35   ASSESSMENT: 75 y.o. [place] woman status post left breast upper outer quadrant biopsy 10/11/2014 showing ductal carcinoma in situ, grade 2, estrogen and progesterone receptor positive  (1) status post left lumpectomy and sentinel lymph node sampling 11/02/2014 for a pT1a pN0, stage IA invasive ductal carcinoma, grade 2, estrogen receptor 100% positive, progesterone receptor 76% positive, with an MIB-1 of 17%, and no HER-2 amplification. Margins were focally involved by ductal carcinoma in situ  (a) additional surgery 11/17/2014 showed DCIS still close but now negative margins   (2) radiation oncology 12/29/2014 through 02/09/2015:   Left breast 4600 cGy in 23 sessions left breast boost 1400 cGy in 7 sessions   (3) anastrozole started 02/16/2015  (a) bone density February 2015 was normal.  PLAN: Linda Singh continues to do well on the anastrozole. The plan is to continue that for a total of 5 years.  She is having nocturia 3 or 4 times a night. The simplest thing to try is to have nothing by mouth 2 hours before bedtime. If that does not work we can consider referral to a urologist.  She is due for bone density. She tells me she would like to have it done at the breast center where she gets her mammography. We will place that appointment sometime in March.  I have encouraged her to walk at least 5 days a week a minimum of 30 minutes. She does have a friend who encourages her as well. If she does then she also may find her sleep pattern  improve  At this point I'm comfortable seeing her on a once a year basis. She knows to call for any problems develop before her next visit here. Chauncey Cruel, MD   11/27/2015 1:19 PM Medical Oncology and Hematology [facility] [address] [place], [place] [address] Tel. [phone]    Fax. [phone]

## 2015-12-21 ENCOUNTER — Ambulatory Visit
Admission: RE | Admit: 2015-12-21 | Discharge: 2015-12-21 | Disposition: A | Discharge status: Home / Self Care | Payer: Medicare Other | Source: Ambulatory Visit | Attending: Oncology | Admitting: Oncology

## 2015-12-21 DIAGNOSIS — Z780 Asymptomatic menopausal state: Secondary | ICD-10-CM | POA: Diagnosis not present

## 2015-12-21 DIAGNOSIS — C50412 Malignant neoplasm of upper-outer quadrant of left female breast: Secondary | ICD-10-CM

## 2016-01-04 ENCOUNTER — Telehealth: Payer: Self-pay

## 2016-01-04 NOTE — Telephone Encounter (Signed)
Writer called patient with bone density results which per Dr. Jana Hakim were normal.  Patient stated understanding.

## 2016-01-04 NOTE — Telephone Encounter (Signed)
Patient calling for bone density results .  Results printed and given to MD, waiting for answer.  Patient aware.

## 2016-04-02 DIAGNOSIS — D0510 Intraductal carcinoma in situ of unspecified breast: Secondary | ICD-10-CM | POA: Diagnosis not present

## 2016-04-02 DIAGNOSIS — Z23 Encounter for immunization: Secondary | ICD-10-CM | POA: Diagnosis not present

## 2016-04-02 DIAGNOSIS — I10 Essential (primary) hypertension: Secondary | ICD-10-CM | POA: Diagnosis not present

## 2016-04-02 DIAGNOSIS — R7309 Other abnormal glucose: Secondary | ICD-10-CM | POA: Diagnosis not present

## 2016-06-20 ENCOUNTER — Other Ambulatory Visit: Payer: Self-pay

## 2016-06-20 DIAGNOSIS — H8103 Meniere's disease, bilateral: Secondary | ICD-10-CM | POA: Diagnosis not present

## 2016-06-20 DIAGNOSIS — H903 Sensorineural hearing loss, bilateral: Secondary | ICD-10-CM | POA: Diagnosis not present

## 2016-06-20 DIAGNOSIS — H9312 Tinnitus, left ear: Secondary | ICD-10-CM | POA: Diagnosis not present

## 2016-07-09 ENCOUNTER — Other Ambulatory Visit: Payer: Self-pay | Admitting: Oncology

## 2016-07-09 DIAGNOSIS — C50412 Malignant neoplasm of upper-outer quadrant of left female breast: Secondary | ICD-10-CM

## 2016-07-10 ENCOUNTER — Other Ambulatory Visit: Payer: Self-pay | Admitting: *Deleted

## 2016-07-16 DIAGNOSIS — H903 Sensorineural hearing loss, bilateral: Secondary | ICD-10-CM | POA: Diagnosis not present

## 2016-07-16 DIAGNOSIS — J342 Deviated nasal septum: Secondary | ICD-10-CM | POA: Diagnosis not present

## 2016-08-20 ENCOUNTER — Other Ambulatory Visit: Payer: Self-pay | Admitting: Oncology

## 2016-08-20 DIAGNOSIS — Z853 Personal history of malignant neoplasm of breast: Secondary | ICD-10-CM

## 2016-09-23 ENCOUNTER — Ambulatory Visit
Admission: RE | Admit: 2016-09-23 | Discharge: 2016-09-23 | Disposition: A | Discharge status: Home / Self Care | Payer: Medicare Other | Source: Ambulatory Visit | Attending: Oncology | Admitting: Oncology

## 2016-09-23 DIAGNOSIS — Z853 Personal history of malignant neoplasm of breast: Secondary | ICD-10-CM

## 2016-09-23 DIAGNOSIS — R928 Other abnormal and inconclusive findings on diagnostic imaging of breast: Secondary | ICD-10-CM | POA: Diagnosis not present

## 2016-09-24 DIAGNOSIS — M1711 Unilateral primary osteoarthritis, right knee: Secondary | ICD-10-CM | POA: Diagnosis not present

## 2016-09-24 DIAGNOSIS — M25561 Pain in right knee: Secondary | ICD-10-CM | POA: Diagnosis not present

## 2016-09-24 DIAGNOSIS — M25461 Effusion, right knee: Secondary | ICD-10-CM | POA: Diagnosis not present

## 2016-10-03 ENCOUNTER — Other Ambulatory Visit: Payer: Self-pay | Admitting: Oncology

## 2016-10-03 DIAGNOSIS — C50412 Malignant neoplasm of upper-outer quadrant of left female breast: Secondary | ICD-10-CM

## 2016-11-22 ENCOUNTER — Telehealth: Payer: Self-pay | Admitting: Oncology

## 2016-11-22 NOTE — Telephone Encounter (Signed)
Pt called to r/s appts to April due tp work schedule. Gave pt new appt date times per request

## 2016-11-26 ENCOUNTER — Other Ambulatory Visit: Payer: Medicare Other

## 2016-12-03 ENCOUNTER — Ambulatory Visit: Payer: Medicare Other | Admitting: Oncology

## 2017-01-03 ENCOUNTER — Other Ambulatory Visit: Payer: Self-pay | Admitting: Adult Health

## 2017-01-03 DIAGNOSIS — Z170 Estrogen receptor positive status [ER+]: Principal | ICD-10-CM

## 2017-01-03 DIAGNOSIS — C50412 Malignant neoplasm of upper-outer quadrant of left female breast: Secondary | ICD-10-CM

## 2017-01-06 ENCOUNTER — Telehealth: Payer: Self-pay | Admitting: *Deleted

## 2017-01-06 ENCOUNTER — Other Ambulatory Visit (HOSPITAL_BASED_OUTPATIENT_CLINIC_OR_DEPARTMENT_OTHER): Payer: Medicare Other

## 2017-01-06 DIAGNOSIS — C50412 Malignant neoplasm of upper-outer quadrant of left female breast: Secondary | ICD-10-CM | POA: Diagnosis present

## 2017-01-06 DIAGNOSIS — Z170 Estrogen receptor positive status [ER+]: Principal | ICD-10-CM

## 2017-01-06 LAB — COMPREHENSIVE METABOLIC PANEL
ALBUMIN: 3.9 g/dL (ref 3.5–5.0)
ALK PHOS: 72 U/L (ref 40–150)
ALT: 11 U/L (ref 0–55)
ANION GAP: 8 meq/L (ref 3–11)
AST: 21 U/L (ref 5–34)
BUN: 13.7 mg/dL (ref 7.0–26.0)
CALCIUM: 9.8 mg/dL (ref 8.4–10.4)
CO2: 29 mEq/L (ref 22–29)
Chloride: 102 mEq/L (ref 98–109)
Creatinine: 1 mg/dL (ref 0.6–1.1)
EGFR: 67 mL/min/{1.73_m2} — AB (ref >90)
Glucose: 204 mg/dl — ABNORMAL HIGH (ref 70–140)
Potassium: 3.1 mEq/L — ABNORMAL LOW (ref 3.5–5.1)
Sodium: 139 mEq/L (ref 136–145)
Total Bilirubin: 0.43 mg/dL (ref 0.20–1.20)
Total Protein: 7 g/dL (ref 6.4–8.3)

## 2017-01-06 LAB — CBC WITH DIFFERENTIAL/PLATELET
BASO%: 0.6 % (ref 0.0–2.0)
Basophils Absolute: 0 10*3/uL (ref 0.0–0.1)
EOS%: 1.7 % (ref 0.0–7.0)
Eosinophils Absolute: 0.1 10*3/uL (ref 0.0–0.5)
HEMATOCRIT: 42.1 % (ref 34.8–46.6)
HGB: 14.1 g/dL (ref 11.6–15.9)
LYMPH%: 30.8 % (ref 14.0–49.7)
MCH: 30.4 pg (ref 25.1–34.0)
MCHC: 33.4 g/dL (ref 31.5–36.0)
MCV: 91 fL (ref 79.5–101.0)
MONO#: 0.3 10*3/uL (ref 0.1–0.9)
MONO%: 4.9 % (ref 0.0–14.0)
NEUT#: 3.9 10*3/uL (ref 1.5–6.5)
NEUT%: 62 % (ref 38.4–76.8)
Platelets: 211 10*3/uL (ref 145–400)
RBC: 4.62 10*6/uL (ref 3.70–5.45)
RDW: 13.4 % (ref 11.2–14.5)
WBC: 6.3 10*3/uL (ref 3.9–10.3)
lymph#: 1.9 10*3/uL (ref 0.9–3.3)

## 2017-01-06 NOTE — Telephone Encounter (Signed)
Left voice message for Linda Singh to discuss lab results, left contact information for patient to return call.

## 2017-01-07 ENCOUNTER — Telehealth: Payer: Self-pay

## 2017-01-07 NOTE — Telephone Encounter (Signed)
s/w pt, per Wilber Bihari NP, that her K+ is low and to eat high K+ foods. We went over list of foods. Also that her glucose was elevated. These labs were faxed to Dr Orland Mustard. Pt was going to call for an appt to discuss these with Dr Orland Mustard.

## 2017-01-08 DIAGNOSIS — R7309 Other abnormal glucose: Secondary | ICD-10-CM | POA: Diagnosis not present

## 2017-01-08 DIAGNOSIS — E876 Hypokalemia: Secondary | ICD-10-CM | POA: Diagnosis not present

## 2017-01-08 DIAGNOSIS — D0510 Intraductal carcinoma in situ of unspecified breast: Secondary | ICD-10-CM | POA: Diagnosis not present

## 2017-01-13 ENCOUNTER — Ambulatory Visit (HOSPITAL_BASED_OUTPATIENT_CLINIC_OR_DEPARTMENT_OTHER): Payer: Medicare Other | Admitting: Oncology

## 2017-01-13 ENCOUNTER — Telehealth: Payer: Self-pay | Admitting: Oncology

## 2017-01-13 VITALS — BP 128/86 | HR 95 | Temp 97.6°F | Resp 18 | Ht 66.0 in | Wt 181.1 lb

## 2017-01-13 DIAGNOSIS — R739 Hyperglycemia, unspecified: Secondary | ICD-10-CM

## 2017-01-13 DIAGNOSIS — Z170 Estrogen receptor positive status [ER+]: Secondary | ICD-10-CM

## 2017-01-13 DIAGNOSIS — C50412 Malignant neoplasm of upper-outer quadrant of left female breast: Secondary | ICD-10-CM | POA: Diagnosis not present

## 2017-01-13 DIAGNOSIS — R875 Abnormal microbiological findings in specimens from female genital organs: Secondary | ICD-10-CM | POA: Diagnosis not present

## 2017-01-13 DIAGNOSIS — Z111 Encounter for screening for respiratory tuberculosis: Secondary | ICD-10-CM | POA: Diagnosis not present

## 2017-01-13 MED ORDER — POTASSIUM CHLORIDE ER 10 MEQ PO CPCR: 10.0000 meq | ORAL_CAPSULE | Freq: Two times a day (BID) | ORAL | 4 refills | Status: DC

## 2017-01-13 MED ORDER — ANASTROZOLE 1 MG PO TABS: 1.0000 mg | ORAL_TABLET | Freq: Every day | ORAL | 3 refills | Status: DC

## 2017-01-13 NOTE — Progress Notes (Signed)
[facility]  Telephone:(336) [phone] Fax:(336) [phone]     ID: SEMIYAH NEWGENT DOB: March 25, 1941  MR#: [id number]  TTS#:[id number]  Patient Care Team: London Pepper, MD as PCP - General (Family Medicine) Stark Klein, MD as Consulting Physician (General Surgery) Arloa Koh, MD as Consulting Physician (Radiation Oncology) Chauncey Cruel, MD as Consulting Physician (Oncology) Holley Bouche, NP as Nurse Practitioner (Nurse Practitioner) Rockwell Germany, RN as Registered Nurse Mauro Kaufmann, RN as Registered Nurse OTHER MD:  CHIEF COMPLAINT: Ductal carcinoma in situ  CURRENT TREATMENT: Anastrozole   BREAST CANCER HISTORY: From the original intake note:  Ms. Wakeman had routine screening mammography at the [facility] 09/20/2014 going showing her breast density to be category B. In the left breast new calcifications were noted, and these were further evaluated with left diagnostic mammography 09/27/2014. The calcifications when the upper outer quadrant, and were mildly irregular and linear. Biopsy was performed 10/11/2014 and showed ([id number] [id number]) ductal carcinoma in situ, grade 2, estrogen receptor 100% positive, progesterone receptor 30% positive, both with strong staining intensity.  MRI was scheduled but the patient was unable to tolerate it.  Her subsequent history is as detailed below  INTERVAL HISTORY: Jolanda returns today for follow-up of her estrogen receptor positive breast cancer. She continues on anastrozole. She tolerates this well. Hot flashes and vaginal dryness are not major concerns. Currently she is pain on dollars per month for this pill.  REVIEW OF SYSTEMS: She complains of insomnia. She wakes up 3 or 4 times a night. Each time she has to urinate. She does not have night sweats. She is not exercising regularly. She used to walk up to 3 miles today with a friend but she is not doing that at present. Aside from these issues a detailed review of  systems today was noncontributory  PAST MEDICAL HISTORY: Past Medical History:  Diagnosis Date  . Breast cancer, left breast 11/02/14   Upper, Outer Quadrant  . Hypertension    hx  . S/P radiation therapy 12/29/2014 through 02/09/2015       Left breast 4600 cGy in 23 sessions left breast boost 1400 cGy in 7 sessions   . Wears glasses   . Wears partial dentures     PAST SURGICAL HISTORY: Past Surgical History:  Procedure Laterality Date  . ABDOMINAL HYSTERECTOMY    . COLONOSCOPY    . RADIOACTIVE SEED GUIDED MASTECTOMY WITH AXILLARY SENTINEL LYMPH NODE BIOPSY Left 11/02/2014   Procedure: RADIOACTIVE SEED GUIDED PARTIAL MASTECTOMY WITH AXILLARY SENTINEL LYMPH NODE BIOPSY;  Surgeon: Stark Klein, MD;  Location: [facility];  Service: General;  Laterality: Left;  . RE-EXCISION OF BREAST LUMPECTOMY Left 11/17/2014   Procedure: RE-EXCISION OF LEFT BREAST LUMPECTOMY;  Surgeon: Stark Klein, MD;  Location: [facility];  Service: General;  Laterality: Left;    FAMILY HISTORY Family History  Problem Relation Age of Onset  . Heart attack Father    The patient's father died in an automobile accident at age 40. The patient's mother died at age 73. The patient had 2 brothers, 6 sisters. There is no history of breast or ovarian cancer in the family.  GYNECOLOGIC HISTORY:  No LMP recorded. Patient is postmenopausal. Menarche age 35. First live birth age 40. The patient is GX P3. She status post total abdominal hysterectomy with bilateral salpingo-oophorectomy. She did not take hormone replacement. 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 Her son [name] works as a Psychologist, sport and exercise in [place]. Her son [name] is Quarry manager. of [employer] in [place]. The patient has  3 grandchildren. She attends [employer]    ADVANCED DIRECTIVES: Not in place   HEALTH MAINTENANCE: Social History  Substance Use Topics  . Smoking status: Never Smoker  . Smokeless tobacco: Never Used  . Alcohol use No     Colonoscopy: 2015/[facility]  PAP: Status post hysterectomy  Bone density:  Lipid panel:  Allergies  Allergen Reactions  . Amoxicillin     Does not know  . Contrast Media [Iodinated Diagnostic Agents] Nausea And Vomiting    Current Outpatient Prescriptions  Medication Sig Dispense Refill  . anastrozole (ARIMIDEX) 1 MG tablet TAKE 1 TABLET (1 MG TOTAL) BY MOUTH DAILY. 90 tablet 3  . calcium carbonate (OS-CAL) 600 MG TABS tablet Take 600 mg by mouth 2 (two) times daily with a meal.    . hydrochlorothiazide (HYDRODIURIL) 25 MG tablet Take 25 mg by mouth daily.    Marland Kitchen ibuprofen (ADVIL,MOTRIN) 200 MG tablet Take 200 mg by mouth every 6 (six) hours as needed.    Marland Kitchen omega-3 acid ethyl esters (LOVAZA) 1 G capsule Take 1 g by mouth daily.     No current facility-administered medications for this visit.     OBJECTIVE: Older African-American woman in no acute distress Vitals:   01/13/17 0927  BP: 128/86  Pulse: 95  Resp: 18  Temp: 97.6 F (36.4 C)     Body mass index is 29.23 kg/m.    ECOG FS:0 - Asymptomatic  Sclerae unicteric, pupils round and equal Oropharynx clear and moist-- no thrush or other lesions No cervical or supraclavicular adenopathy Lungs no rales or rhonchi Heart regular rate and rhythm Abd soft, nontender, positive bowel sounds MSK no focal spinal tenderness, no upper extremity lymphedema Neuro: nonfocal, well oriented, appropriate affect Breasts: The right breast is unremarkable. The left breast is status post lumpectomy and radiation. There is the expected thickening of the skin and mild hyperpigmentation. There is no evidence of local recurrence. Left axilla is benign.    LAB RESULTS:  CMP     Component Value Date/Time   NA  139 01/06/2017 1452   K 3.1 (L) 01/06/2017 1452   CO2 29 01/06/2017 1452   GLUCOSE 204 (H) 01/06/2017 1452   BUN 13.7 01/06/2017 1452   CREATININE 1.0 01/06/2017 1452   CALCIUM 9.8 01/06/2017 1452   PROT 7.0 01/06/2017 1452   ALBUMIN 3.9 01/06/2017 1452   AST 21 01/06/2017 1452   ALT 11 01/06/2017 1452   ALKPHOS 72 01/06/2017 1452   BILITOT 0.43 01/06/2017 1452    INo results found for: SPEP, UPEP  Lab Results  Component Value Date   WBC 6.3 01/06/2017   NEUTROABS 3.9 01/06/2017   HGB 14.1 01/06/2017   HCT 42.1 01/06/2017   MCV 91.0 01/06/2017   PLT 211 01/06/2017      Chemistry      Component Value Date/Time   NA 139 01/06/2017 1452   K 3.1 (L) 01/06/2017 1452   CO2 29 01/06/2017 1452   BUN 13.7 01/06/2017 1452   CREATININE 1.0 01/06/2017 1452      Component Value Date/Time   CALCIUM 9.8 01/06/2017 1452   ALKPHOS 72 01/06/2017 1452   AST 21 01/06/2017 1452   ALT 11 01/06/2017 1452   BILITOT 0.43 01/06/2017 1452  No results found for: LABCA2  No components found for: [id number]  No results for input(s): INR in the last 168 hours.  Urinalysis No results found for: COLORURINE, APPEARANCEUR, LABSPEC, [place], GLUCOSEU, HGBUR, BILIRUBINUR, KETONESUR, PROTEINUR, UROBILINOGEN, NITRITE, LEUKOCYTESUR  STUDIES: CLINICAL DATA: History of left breast cancer status post lumpectomy 2015.  EXAM: DIGITAL DIAGNOSTIC BILATERAL MAMMOGRAM WITH 3D TOMOSYNTHESIS AND CAD  COMPARISON: Previous exam(s).  ACR Breast Density Category b: There are scattered areas of fibroglandular density.  FINDINGS: Lumpectomy changes are seen in the left breast. There is no suspicious mass or malignant type microcalcifications in either breast.  Mammographic images were processed with CAD.  IMPRESSION: No evidence of malignancy in either breast.  RECOMMENDATION: Bilateral diagnostic mammogram in 1 year is recommended.  I have discussed the findings and  recommendations with the patient. Results were also provided in writing at the conclusion of the visit. If applicable, a reminder letter will be sent to the patient regarding the next appointment.  BI-RADS CATEGORY 2: Benign.   Electronically Signed  By: Lillia Mountain M.D.  On: 09/22/2015 13:35   ASSESSMENT: 75 y.o. [place] woman status post left breast upper outer quadrant biopsy 10/11/2014 showing ductal carcinoma in situ, grade 2, estrogen and progesterone receptor positive  (1) status post left lumpectomy and sentinel lymph node sampling 11/02/2014 for a pT1a pN0, stage IA invasive ductal carcinoma, grade 2, estrogen receptor 100% positive, progesterone receptor 76% positive, with an MIB-1 of 17%, and no HER-2 amplification. Margins were focally involved by ductal carcinoma in situ  (a) additional surgery 11/17/2014 showed DCIS still close but now negative margins   (2) radiation oncology 12/29/2014 through 02/09/2015:   Left breast 4600 cGy in 23 sessions left breast boost 1400 cGy in 7 sessions   (3) anastrozole started 02/16/2015  (a) bone density February 2015 was normal.  PLAN: Ms. Cockrum is now a little over 2 years out from definitive surgery for her breast cancer with no evidence of disease recurrence. This is very favorable.  She is tolerating the anastrozole with no significant side effects. The plan will be to continue that for a total of 5 years.   She brought me a school for which I was glad to fill out for her to the best of my ability but she understands I do not have all her vaccination records and she will need to get that and the TB test results from her primary care physician.  Recent lab work showed a potassium of 3.1. She is on hydrochlorothiazide but not on potassium supplementations. I went ahead and wrote her for Micro-K 10 mEq to take daily. She also had a blood sugar greater than  200 which she will discuss with her primary care physician.  She knows to call for any problems that may develop before her next visit here.  Chauncey Cruel, MD   01/13/2017 9:46 AM Medical Oncology and Hematology [facility] [address] [place], [place] [address] Tel. [phone]    Fax. [phone]

## 2017-01-13 NOTE — Telephone Encounter (Signed)
Gave patient avs report and appointments for April 2019.  °

## 2017-01-28 DIAGNOSIS — E876 Hypokalemia: Secondary | ICD-10-CM | POA: Diagnosis not present

## 2017-02-24 DIAGNOSIS — Z91038 Other insect allergy status: Secondary | ICD-10-CM | POA: Diagnosis not present

## 2017-02-27 DIAGNOSIS — W57XXXA Bitten or stung by nonvenomous insect and other nonvenomous arthropods, initial encounter: Secondary | ICD-10-CM | POA: Diagnosis not present

## 2017-02-27 DIAGNOSIS — S70362A Insect bite (nonvenomous), left thigh, initial encounter: Secondary | ICD-10-CM | POA: Diagnosis not present

## 2017-02-27 DIAGNOSIS — J069 Acute upper respiratory infection, unspecified: Secondary | ICD-10-CM | POA: Diagnosis not present

## 2017-03-31 ENCOUNTER — Telehealth: Payer: Self-pay | Admitting: *Deleted

## 2017-03-31 NOTE — Telephone Encounter (Signed)
This RN spoke with pt per her call stating she is on vacation and forgot to pack her anastrozole - she will not return home 04/06/2017. " should I get a prescription called in where I am to take until I get home ?"  This RN informed pt above not medically indicated but to resume taking medication upon return home.  No other questions or concerns.

## 2017-05-19 DIAGNOSIS — R7303 Prediabetes: Secondary | ICD-10-CM | POA: Diagnosis not present

## 2017-05-19 DIAGNOSIS — Z0000 Encounter for general adult medical examination without abnormal findings: Secondary | ICD-10-CM | POA: Diagnosis not present

## 2017-05-19 DIAGNOSIS — Z136 Encounter for screening for cardiovascular disorders: Secondary | ICD-10-CM | POA: Diagnosis not present

## 2017-05-19 DIAGNOSIS — M79645 Pain in left finger(s): Secondary | ICD-10-CM | POA: Diagnosis not present

## 2017-05-19 DIAGNOSIS — Z13820 Encounter for screening for osteoporosis: Secondary | ICD-10-CM | POA: Diagnosis not present

## 2017-05-19 DIAGNOSIS — Z8639 Personal history of other endocrine, nutritional and metabolic disease: Secondary | ICD-10-CM | POA: Diagnosis not present

## 2017-05-19 DIAGNOSIS — I10 Essential (primary) hypertension: Secondary | ICD-10-CM | POA: Diagnosis not present

## 2017-05-19 DIAGNOSIS — D0510 Intraductal carcinoma in situ of unspecified breast: Secondary | ICD-10-CM | POA: Diagnosis not present

## 2017-06-03 DIAGNOSIS — J209 Acute bronchitis, unspecified: Secondary | ICD-10-CM | POA: Diagnosis not present

## 2017-06-03 DIAGNOSIS — I10 Essential (primary) hypertension: Secondary | ICD-10-CM | POA: Diagnosis not present

## 2017-06-03 DIAGNOSIS — K5900 Constipation, unspecified: Secondary | ICD-10-CM | POA: Diagnosis not present

## 2017-07-10 ENCOUNTER — Telehealth: Payer: Self-pay

## 2017-07-10 NOTE — Telephone Encounter (Signed)
Pt called to have her anastrozole rx transferred [facility]. This is for cost purposes. Instructed pt to [facility] and have them transfer rx.  She was agreeable to this

## 2017-07-23 DIAGNOSIS — M8588 Other specified disorders of bone density and structure, other site: Secondary | ICD-10-CM | POA: Diagnosis not present

## 2017-08-20 DIAGNOSIS — D224 Melanocytic nevi of scalp and neck: Secondary | ICD-10-CM | POA: Diagnosis not present

## 2017-08-20 DIAGNOSIS — L821 Other seborrheic keratosis: Secondary | ICD-10-CM | POA: Diagnosis not present

## 2017-08-22 ENCOUNTER — Other Ambulatory Visit: Payer: Self-pay | Admitting: Oncology

## 2017-08-22 DIAGNOSIS — Z1231 Encounter for screening mammogram for malignant neoplasm of breast: Secondary | ICD-10-CM

## 2017-09-06 ENCOUNTER — Other Ambulatory Visit: Payer: Self-pay | Admitting: Oncology

## 2017-09-24 ENCOUNTER — Ambulatory Visit
Admission: RE | Admit: 2017-09-24 | Discharge: 2017-09-24 | Disposition: A | Discharge status: Home / Self Care | Payer: Medicare Other | Source: Ambulatory Visit | Attending: Oncology | Admitting: Oncology

## 2017-09-24 DIAGNOSIS — Z1231 Encounter for screening mammogram for malignant neoplasm of breast: Secondary | ICD-10-CM

## 2017-09-24 DIAGNOSIS — R928 Other abnormal and inconclusive findings on diagnostic imaging of breast: Secondary | ICD-10-CM | POA: Diagnosis not present

## 2017-09-24 HISTORY — DX: Personal history of irradiation: Z92.3

## 2017-10-10 ENCOUNTER — Other Ambulatory Visit: Payer: Self-pay | Admitting: Oncology

## 2017-10-10 DIAGNOSIS — Z170 Estrogen receptor positive status [ER+]: Principal | ICD-10-CM

## 2017-10-10 DIAGNOSIS — C50412 Malignant neoplasm of upper-outer quadrant of left female breast: Secondary | ICD-10-CM

## 2017-10-22 ENCOUNTER — Other Ambulatory Visit: Payer: Self-pay | Admitting: Oncology

## 2017-10-22 DIAGNOSIS — R05 Cough: Secondary | ICD-10-CM | POA: Diagnosis not present

## 2017-10-22 DIAGNOSIS — J329 Chronic sinusitis, unspecified: Secondary | ICD-10-CM | POA: Diagnosis not present

## 2017-12-14 IMAGING — MG 2D DIGITAL DIAGNOSTIC BILATERAL MAMMOGRAM WITH CAD AND ADJUNCT T
8 of 13 series · 8 of 29 positions shown · non-contrast
Comparison: Previous exam(s).

CLINICAL DATA: History of a left lumpectomy for breast carcinoma in
3140.No current breast complaints.

EXAM:
2D DIGITAL DIAGNOSTIC BILATERAL MAMMOGRAM WITH CAD AND ADJUNCT TOMO

[L MLO (1 of 2)]
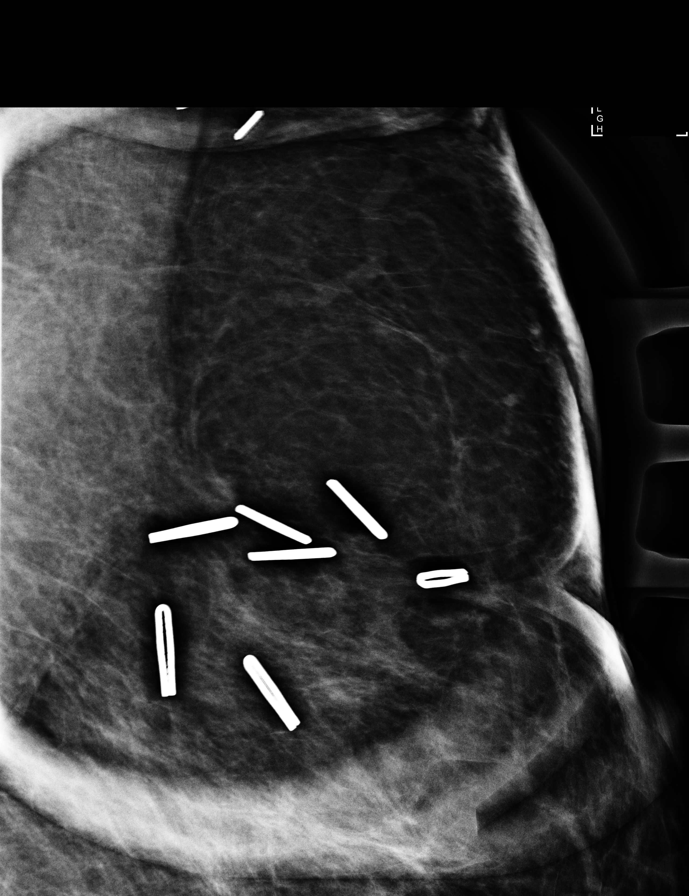

[R CC]
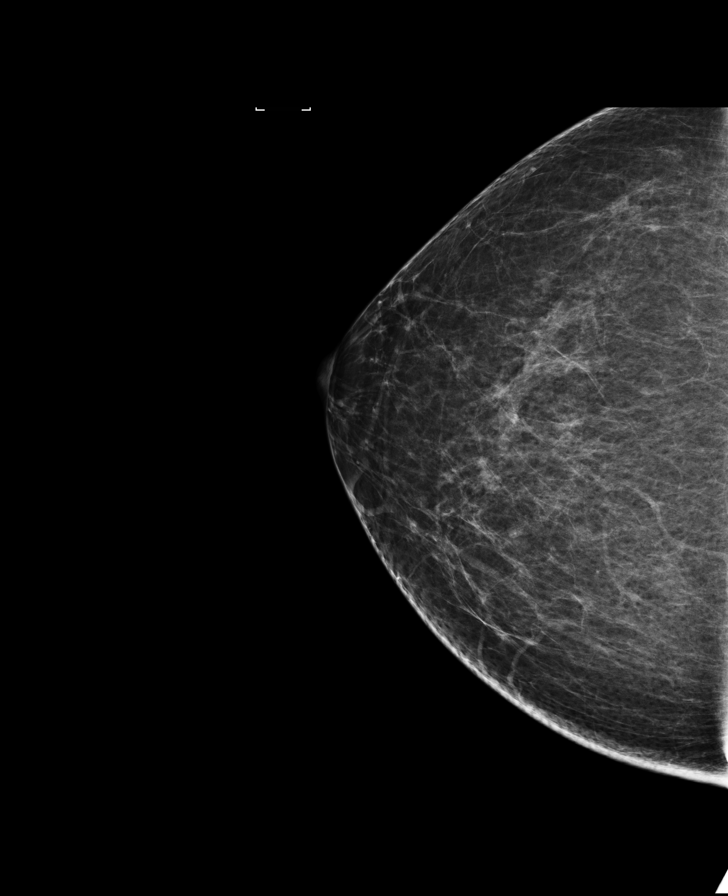

[R MLO]
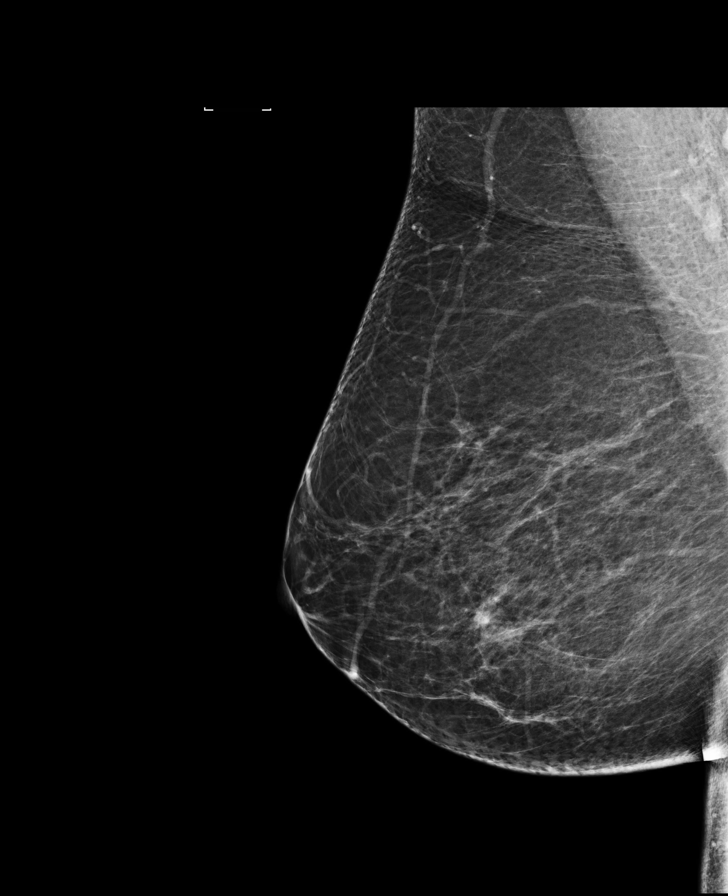

[R CC synth-2D]
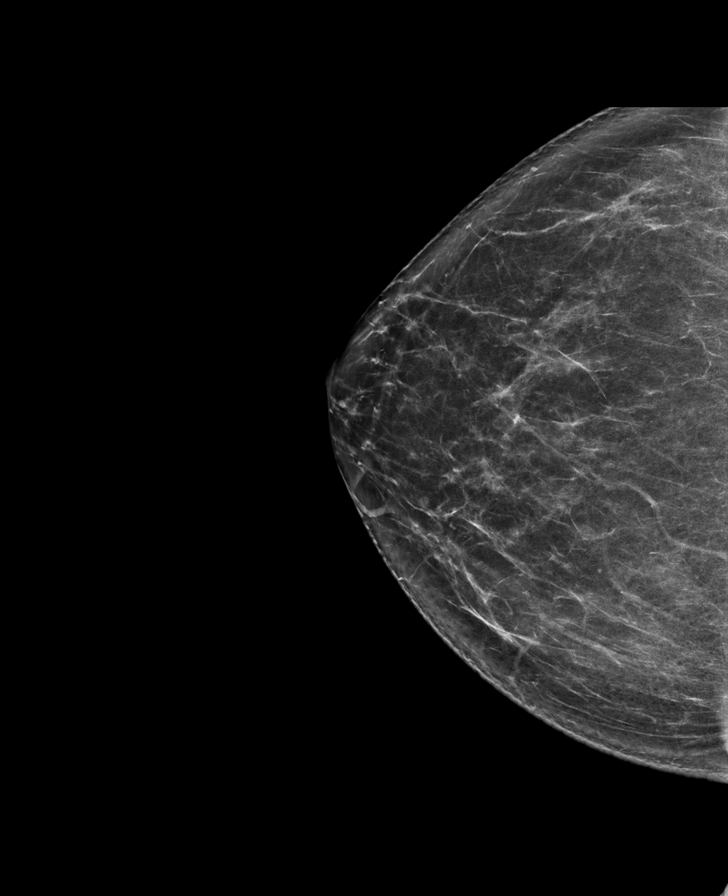

[L CC]
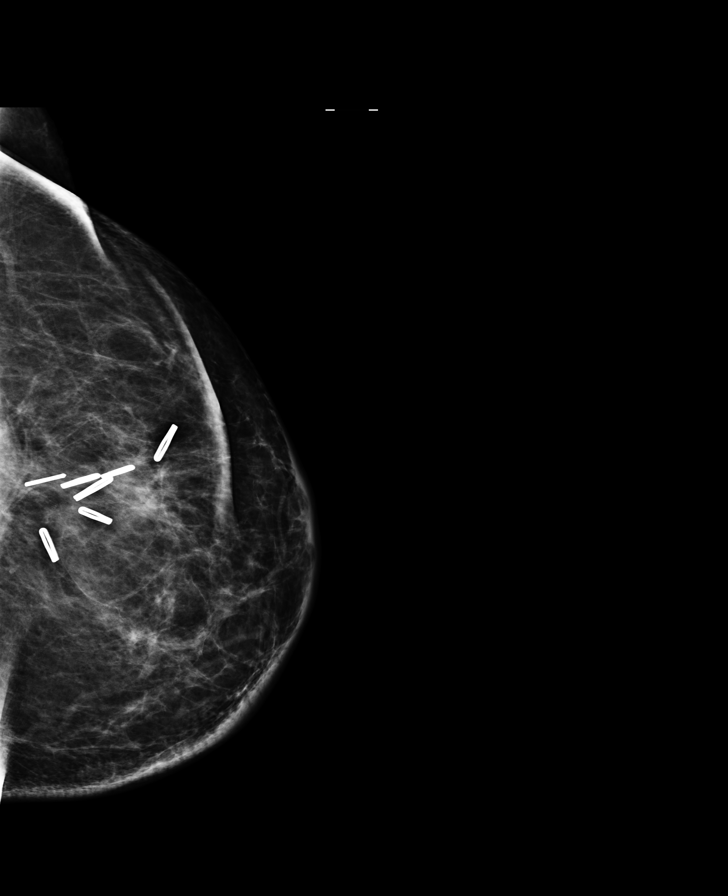

[L MLO (2 of 2)]
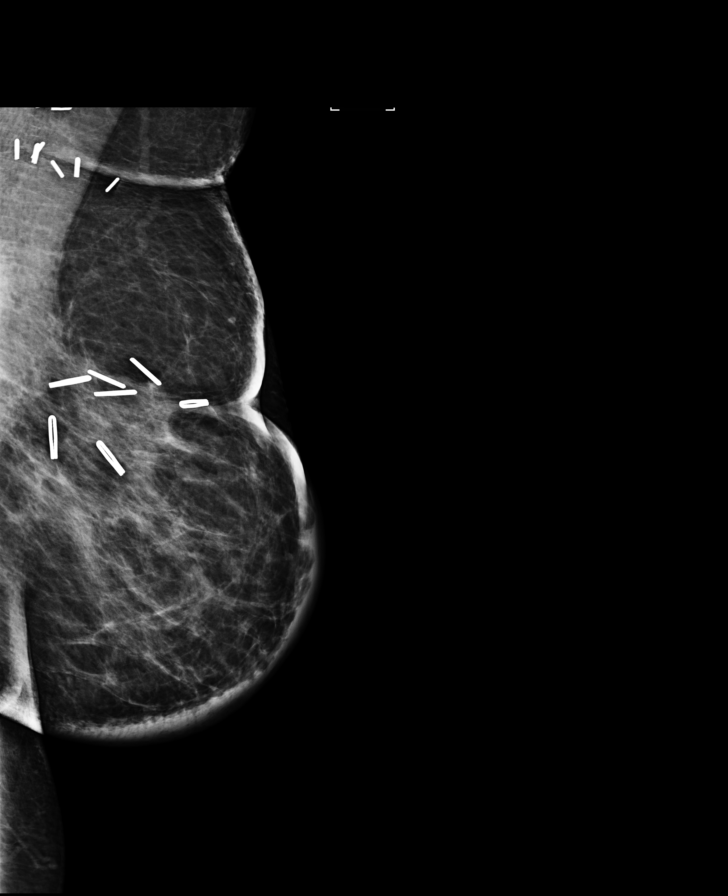

[L CC synth-2D]
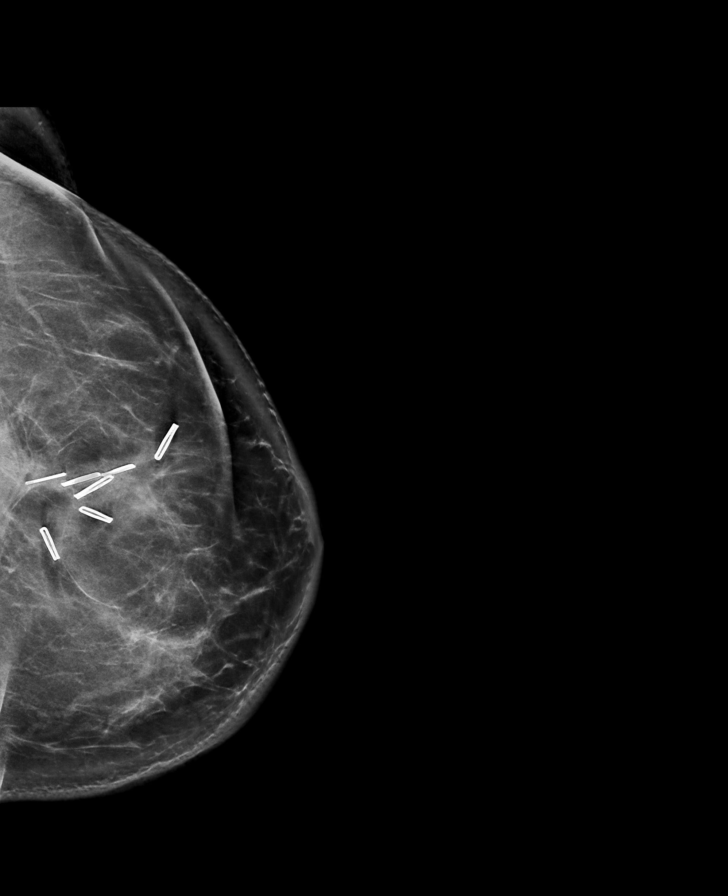

[R MLO synth-2D]
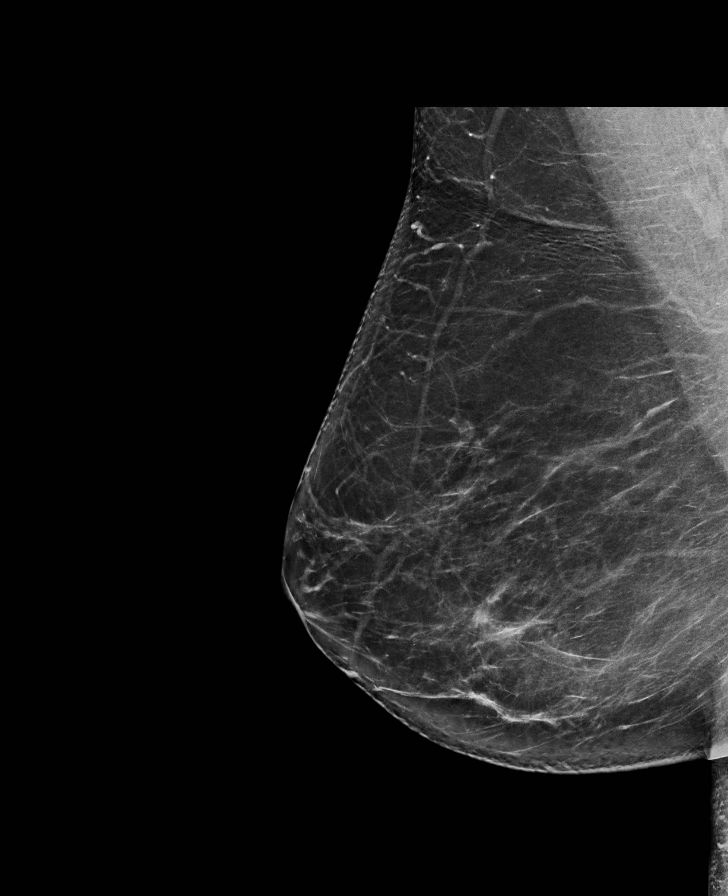

[8 of 29 positions shown; findings below may reference images not displayed]

ACR Breast Density Category b: There are scattered areas of
fibroglandular density.
FINDINGS: Architectural distortion in the upper left breast reflects the post
lumpectomy scarring.

There are no discrete masses or other areas of architectural
distortion. There are no new or suspicious calcifications.

Mammographic images were processed with CAD.
IMPRESSION: 1. No evidence of new or recurrent breast carcinoma.
2. Benign postsurgical changes on the left.

RECOMMENDATION:
Diagnostic mammography in 1 year per standard post lumpectomy
protocol.

I have discussed the findings and recommendations with the patient.
Results were also provided in writing at the conclusion of the
visit. If applicable, a reminder letter will be sent to the patient
regarding the next appointment.

BI-RADS CATEGORY  2: Benign.

## 2018-01-06 DIAGNOSIS — J069 Acute upper respiratory infection, unspecified: Secondary | ICD-10-CM | POA: Diagnosis not present

## 2018-01-12 ENCOUNTER — Inpatient Hospital Stay: Payer: Medicare Other | Attending: Oncology

## 2018-01-12 DIAGNOSIS — C50412 Malignant neoplasm of upper-outer quadrant of left female breast: Secondary | ICD-10-CM | POA: Insufficient documentation

## 2018-01-12 DIAGNOSIS — Z170 Estrogen receptor positive status [ER+]: Secondary | ICD-10-CM | POA: Diagnosis not present

## 2018-01-12 LAB — COMPREHENSIVE METABOLIC PANEL
ALT: 9 U/L (ref 0–55)
AST: 18 U/L (ref 5–34)
Albumin: 3.8 g/dL (ref 3.5–5.0)
Alkaline Phosphatase: 79 U/L (ref 40–150)
Anion gap: 5 (ref 3–11)
BUN: 11 mg/dL (ref 7–26)
CHLORIDE: 106 mmol/L (ref 98–109)
CO2: 28 mmol/L (ref 22–29)
Calcium: 9.7 mg/dL (ref 8.4–10.4)
Creatinine, Ser: 0.79 mg/dL (ref 0.60–1.10)
GFR calc Af Amer: >60 mL/min (ref >60)
GFR calc non Af Amer: >60 mL/min (ref >60)
GLUCOSE: 89 mg/dL (ref 70–140)
Potassium: 3.8 mmol/L (ref 3.5–5.1)
Sodium: 139 mmol/L (ref 136–145)
Total Bilirubin: 0.4 mg/dL (ref 0.2–1.2)
Total Protein: 7.1 g/dL (ref 6.4–8.3)

## 2018-01-12 LAB — CBC WITH DIFFERENTIAL/PLATELET
Basophils Absolute: 0 10*3/uL (ref 0.0–0.1)
Basophils Relative: 1 %
Eosinophils Absolute: 0.1 10*3/uL (ref 0.0–0.5)
Eosinophils Relative: 2 %
HCT: 43.8 % (ref 34.8–46.6)
Hemoglobin: 14.4 g/dL (ref 11.6–15.9)
LYMPHS ABS: 1.7 10*3/uL (ref 0.9–3.3)
Lymphocytes Relative: 31 %
MCH: 30.4 pg (ref 25.1–34.0)
MCHC: 33 g/dL (ref 31.5–36.0)
MCV: 92.4 fL (ref 79.5–101.0)
MONO ABS: 0.5 10*3/uL (ref 0.1–0.9)
Monocytes Relative: 9 %
Neutro Abs: 3.1 10*3/uL (ref 1.5–6.5)
Neutrophils Relative %: 57 %
Platelets: 220 10*3/uL (ref 145–400)
RBC: 4.74 MIL/uL (ref 3.70–5.45)
RDW: 13.6 % (ref 11.2–14.5)
WBC: 5.5 10*3/uL (ref 3.9–10.3)

## 2018-01-18 ENCOUNTER — Other Ambulatory Visit: Payer: Self-pay | Admitting: Oncology

## 2018-01-18 NOTE — Progress Notes (Signed)
[place]  [phone] [phone]     ID: [name] DOB: 1941-10-04  MR#: [id number]  KNL#:[id number]  Patient Care Team: London Pepper, MD as PCP - General (Family Medicine) Stark Klein, MD as Consulting Physician (General Surgery) Arloa Koh, MD as Consulting Physician (Radiation Oncology) Amand Lemoine, Virgie Dad, MD as Consulting Physician (Oncology) Holley Bouche, NP as Nurse Practitioner (Nurse Practitioner) Rockwell Germany, RN as Registered Nurse Mauro Kaufmann, RN as Registered Nurse OTHER MD:  CHIEF COMPLAINT: Ductal carcinoma in situ  CURRENT TREATMENT: Anastrozole   BREAST CANCER HISTORY: From the original intake note:  Linda Singh had routine screening mammography at the breast Center 09/20/2014 going showing her breast density to be category B. In the left breast new calcifications were noted, and these were further evaluated with left diagnostic mammography 09/27/2014. The calcifications when the upper outer quadrant, and were mildly irregular and linear. Biopsy was performed 10/11/2014 and showed ([id number]) ductal carcinoma in situ, grade 2, estrogen receptor 100% positive, progesterone receptor 30% positive, both with strong staining intensity.  MRI was scheduled but the patient was unable to tolerate it.  Her subsequent history is as detailed below  INTERVAL HISTORY: Linda Singh returns today for follow-up of her estrogen receptor positive breast cancer. She continues on anastrozole, with good tolerance. She denies hot flashes or vaginal dryness.  Since her last visit to the office, she underwent a diagnostic mammogram on 09/24/2017 at [facility] with results showing: Breast density category B. No evidence of new or recurrent breast carcinoma. Benign postsurgical changes on the left.  REVIEW OF SYSTEMS: Cash reports that for exercise, she has been walking. She denies belonging to a gym, however she does walk a lot  of the steps in her house. She reports that she had laryngitis several weeks ago. She used to have a treadmill but she sold it due to not using it. She denies unusual headaches, visual changes, nausea, vomiting, or dizziness. There has been no unusual cough, phlegm production, or pleurisy. This been no change in bowel or bladder habits. She denies unexplained fatigue or unexplained weight loss, bleeding, rash, or fever. A detailed review of systems was otherwise stable.     PAST MEDICAL HISTORY: Past Medical History:  Diagnosis Date  . Breast cancer ([place])   . Breast cancer, left breast ([place]) 11/02/14   Upper, Outer Quadrant  . Hypertension    hx  . Personal history of radiation therapy   . S/P radiation therapy 12/29/2014 through 02/09/2015       Left breast 4600 cGy in 23 sessions left breast boost 1400 cGy in 7 sessions   . Wears glasses   . Wears partial dentures     PAST SURGICAL HISTORY: Past Surgical History:  Procedure Laterality Date  . ABDOMINAL HYSTERECTOMY    . BREAST LUMPECTOMY Left    2016  . COLONOSCOPY    . RADIOACTIVE SEED GUIDED PARTIAL MASTECTOMY WITH AXILLARY SENTINEL LYMPH NODE BIOPSY Left 11/02/2014   Procedure: RADIOACTIVE SEED GUIDED PARTIAL MASTECTOMY WITH AXILLARY SENTINEL LYMPH NODE BIOPSY;  Surgeon: Stark Klein, MD;  Location: [facility];  Service: General;  Laterality: Left;  . RE-EXCISION OF BREAST LUMPECTOMY Left 11/17/2014   Procedure: RE-EXCISION OF LEFT BREAST LUMPECTOMY;  Surgeon: Stark Klein, MD;  Location: [facility];  Service: General;  Laterality: Left;    FAMILY HISTORY Family History  Problem Relation Age of Onset  . Heart attack Father    The  patient's father died in an automobile accident at age 9. The patient's mother died at age 56. The patient had 2 brothers, 6 sisters. There is no history of breast or ovarian cancer in the family.  GYNECOLOGIC HISTORY:   No LMP recorded. Patient is postmenopausal. Menarche age 66. First live birth age 23. The patient is GX P3. She status post total abdominal hysterectomy with bilateral salpingo-oophorectomy. She did not take hormone replacement. 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 Her son [name] works as a Psychologist, sport and exercise in [place]. Her son [name] is Technical brewer. of [employer] in [place]. The patient has 3 grandchildren. She attends [employer]    ADVANCED DIRECTIVES: Not in place   HEALTH MAINTENANCE: Social History   Tobacco Use  . Smoking status: Never Smoker  . Smokeless tobacco: Never Used  Substance Use Topics  . Alcohol use: No  . Drug use: No     Colonoscopy: 2015/[facility]  PAP: Status post hysterectomy  Bone density:  Lipid panel:  Allergies  Allergen Reactions  . Amoxicillin     Does not know  . Contrast Media [Iodinated Diagnostic Agents] Nausea And Vomiting    Current Outpatient Medications  Medication Sig Dispense Refill  . anastrozole (ARIMIDEX) 1 MG tablet TAKE 1 TABLET BY MOUTH ONCE DAILY 90 tablet 2  . calcium carbonate (OS-CAL) 600 MG TABS tablet Take 600 mg by mouth 2 (two) times daily with a meal.    . hydrochlorothiazide (HYDRODIURIL) 25 MG tablet Take 25 mg by mouth daily.    Marland Kitchen ibuprofen (ADVIL,MOTRIN) 200 MG tablet Take 200 mg by mouth every 6 (six) hours as needed.    Marland Kitchen omega-3 acid ethyl esters (LOVAZA) 1 G capsule Take 1 g by mouth daily.    . potassium chloride (MICRO-K) 10 MEQ CR capsule TAKE 1 CAPSULE (10 MEQ TOTAL) BY MOUTH 2 (TWO) TIMES DAILY 90 capsule 0   No current facility-administered medications for this visit.     OBJECTIVE: Older African-American woman who appears well  Vitals:   01/19/18 1254  BP: 122/80  Pulse: 85  Resp: 18  Temp: 98.3 F (36.8 C)  SpO2: 98%     Body  mass index is 29.38 kg/m.    ECOG FS:0 - Asymptomatic  Sclerae unicteric, EOMs intact Oropharynx clear and moist No cervical or supraclavicular adenopathy Lungs no rales or rhonchi Heart regular rate and rhythm Abd soft, nontender, positive bowel sounds MSK no focal spinal tenderness, no upper extremity lymphedema Neuro: nonfocal, well oriented, appropriate affect Breasts: The right breast is benign.  The left breast is status post lumpectomy and radiation.  There is no evidence of local recurrence.  Both axillae are benign.  LAB RESULTS:  CMP     Component Value Date/Time   NA 139 01/12/2018 1234   NA 139 01/06/2017 1452   K 3.8 01/12/2018 1234   K 3.1 (L) 01/06/2017 1452   CL 106 01/12/2018 1234   CO2 28 01/12/2018 1234   CO2 29 01/06/2017 1452   GLUCOSE 89 01/12/2018 1234   GLUCOSE 204 (H) 01/06/2017 1452   BUN 11 01/12/2018 1234   BUN 13.7 01/06/2017 1452   CREATININE 0.79 01/12/2018 1234   CREATININE 1.0 01/06/2017 1452   CALCIUM 9.7 01/12/2018 1234   CALCIUM 9.8 01/06/2017 1452   PROT 7.1 01/12/2018 1234   PROT  7.0 01/06/2017 1452   ALBUMIN 3.8 01/12/2018 1234   ALBUMIN 3.9 01/06/2017 1452   AST 18 01/12/2018 1234   AST 21 01/06/2017 1452   ALT 9 01/12/2018 1234   ALT 11 01/06/2017 1452   ALKPHOS 79 01/12/2018 1234   ALKPHOS 72 01/06/2017 1452   BILITOT 0.4 01/12/2018 1234   BILITOT 0.43 01/06/2017 1452   GFRNONAA >60 01/12/2018 1234   GFRAA >60 01/12/2018 1234    INo results found for: SPEP, UPEP  Lab Results  Component Value Date   WBC 5.5 01/12/2018   NEUTROABS 3.1 01/12/2018   HGB 14.4 01/12/2018   HCT 43.8 01/12/2018   MCV 92.4 01/12/2018   PLT 220 01/12/2018      Chemistry      Component Value Date/Time   NA 139 01/12/2018 1234   NA 139 01/06/2017 1452   K 3.8 01/12/2018 1234   K 3.1 (L) 01/06/2017 1452   CL 106 01/12/2018 1234   CO2 28 01/12/2018 1234   CO2 29 01/06/2017 1452   BUN 11 01/12/2018 1234   BUN 13.7 01/06/2017 1452    CREATININE 0.79 01/12/2018 1234   CREATININE 1.0 01/06/2017 1452      Component Value Date/Time   CALCIUM 9.7 01/12/2018 1234   CALCIUM 9.8 01/06/2017 1452   ALKPHOS 79 01/12/2018 1234   ALKPHOS 72 01/06/2017 1452   AST 18 01/12/2018 1234   AST 21 01/06/2017 1452   ALT 9 01/12/2018 1234   ALT 11 01/06/2017 1452   BILITOT 0.4 01/12/2018 1234   BILITOT 0.43 01/06/2017 1452       No results found for: LABCA2  No components found for: LABCA125  No results for input(s): INR in the last 168 hours.  Urinalysis No results found for: COLORURINE, APPEARANCEUR, LABSPEC, PHURINE, GLUCOSEU, HGBUR, BILIRUBINUR, KETONESUR, PROTEINUR, UROBILINOGEN, NITRITE, LEUKOCYTESUR  STUDIES: Since her last visit to the office, she underwent a diagnostic mammogram on 09/24/2017 at [facility] with results showing: Breast density category B. No evidence of new or recurrent breast carcinoma. Benign postsurgical changes on the left.  ASSESSMENT: 77 y.o. [place] woman status post left breast upper outer quadrant biopsy 10/11/2014 showing ductal carcinoma in situ, grade 2, estrogen and progesterone receptor positive  (1) status post left lumpectomy and sentinel lymph node sampling 11/02/2014 for a pT1a pN0, stage IA invasive ductal carcinoma, grade 2, estrogen receptor 100% positive, progesterone receptor 76% positive, with an MIB-1 of 17%, and no HER-2 amplification. Margins were focally involved by ductal carcinoma in situ  (a) additional surgery 11/17/2014 showed DCIS still close but now negative margins   (2) radiation oncology 12/29/2014 through 02/09/2015:   Left breast 4600 cGy in 23 sessions left breast boost 1400 cGy in 7 sessions   (3) anastrozole started 02/16/2015  (a) bone density February 2015 was normal.  PLAN: Linda Singh is now a little over 3 years out from definitive surgery for her breast cancer with no evidence  of disease recurrence.  This is very favorable.    She is tolerating anastrozole with no side effects at all and that is gratifying.  The plan is to continue this for a total of 5 years  She will see me again year from now.  She knows to call for any problems that may develop before the next visit.  Davisha Linthicum, Virgie Dad, MD  01/19/18 1:19 PM Medical Oncology and Hematology [facility] [address] [place], [place] [address] Tel. [phone]  Fax. [phone]    This document serves as a record of services personally performed by Lurline Del, MD. It was created on his behalf by Steva Colder, a trained medical scribe. The creation of this record is based on the scribe's personal observations and the provider's statements to them.   I have reviewed the above documentation for accuracy and completeness, and I agree with the above.

## 2018-01-19 ENCOUNTER — Telehealth: Payer: Self-pay | Admitting: Oncology

## 2018-01-19 ENCOUNTER — Inpatient Hospital Stay (HOSPITAL_BASED_OUTPATIENT_CLINIC_OR_DEPARTMENT_OTHER): Payer: Medicare Other | Admitting: Oncology

## 2018-01-19 VITALS — BP 122/80 | HR 85 | Temp 98.3°F | Resp 18 | Ht 66.0 in | Wt 182.0 lb

## 2018-01-19 DIAGNOSIS — Z170 Estrogen receptor positive status [ER+]: Secondary | ICD-10-CM | POA: Diagnosis not present

## 2018-01-19 DIAGNOSIS — C50412 Malignant neoplasm of upper-outer quadrant of left female breast: Secondary | ICD-10-CM

## 2018-01-19 MED ORDER — ANASTROZOLE 1 MG PO TABS: 1.0000 mg | ORAL_TABLET | Freq: Every day | ORAL | 4 refills | Status: DC

## 2018-01-19 NOTE — Telephone Encounter (Signed)
Gave avs and calendar ° °

## 2018-03-03 ENCOUNTER — Other Ambulatory Visit: Payer: Self-pay | Admitting: Oncology

## 2018-03-28 DIAGNOSIS — J029 Acute pharyngitis, unspecified: Secondary | ICD-10-CM | POA: Diagnosis not present

## 2018-05-26 ENCOUNTER — Inpatient Hospital Stay: Payer: Medicare Other | Attending: Medical | Admitting: Medical

## 2018-05-26 ENCOUNTER — Telehealth: Payer: Self-pay | Admitting: *Deleted

## 2018-05-26 VITALS — BP 137/94 | HR 83 | Temp 98.2°F | Resp 18 | Ht 66.0 in | Wt 184.5 lb

## 2018-05-26 DIAGNOSIS — C50412 Malignant neoplasm of upper-outer quadrant of left female breast: Secondary | ICD-10-CM

## 2018-05-26 DIAGNOSIS — M79622 Pain in left upper arm: Secondary | ICD-10-CM

## 2018-05-26 DIAGNOSIS — Z170 Estrogen receptor positive status [ER+]: Secondary | ICD-10-CM

## 2018-05-26 DIAGNOSIS — M79629 Pain in unspecified upper arm: Secondary | ICD-10-CM

## 2018-05-26 NOTE — Progress Notes (Signed)
Pt presents with tenderness in L armpit with movement & touch.  States that she thought she felt a lump but wasn't sure.  No redness or heat present.  No lump felt in tissue with palpation.  PA Lucianne Lei to assess.

## 2018-05-26 NOTE — Telephone Encounter (Signed)
Linda Singh states she has a knot under her arm where she had her mastectomy surgery. States it is hard and sore. Will come today at 3:00pm to see [name].  Message to scheduler

## 2018-05-28 NOTE — Progress Notes (Signed)
[facility] Progress Note   Linda Singh [id number] 06-Sep-1941 77 y.o.  Sheron Nightingale is managed by Dr. Jana Hakim  Actively treated with chemotherapy/immunotherapy: no  Current Therapy: Anastrozole  Assessment: Plan:    Axillary tenderness, unspecified laterality  Malignant neoplasm of upper-outer quadrant of left breast in female, estrogen receptor positive ([place])   Please see After Visit Summary for patient specific instructions.  Future Appointments  Date Time Provider [place]  01/20/2019  1:00 PM [facility] LAB 2 [facility] None  01/20/2019  1:30 PM Magrinat, Virgie Dad, MD [facility] None    No orders of the defined types were placed in this encounter.      Subjective:   Patient ID:  Linda Singh is a 77 y.o. (DOB Apr 08, 1941) female.  Chief Complaint:  Chief Complaint  Patient presents with  . Mass    HPI Linda Singh is a 77 year old female with a history of a ductal carcinoma in situ of the left breast who is followed by Dr. Jana Hakim and is currently taking anastrozole.  She presents to the office today with a report that she has a "knot" in her left axilla which is "hard and sore".  She reports general soreness and both of her axilla.  She has had no trauma or changes in activity.  She denies fevers, chills, sweats, chest pain, shortness of breath, nausea, vomiting, constipation, or diarrhea.  Medications: I have reviewed the patient's current medications.  Allergies:  Allergies  Allergen Reactions  . Amoxicillin     Does not know  . Contrast Media [Iodinated Diagnostic Agents] Nausea And Vomiting    Past Medical History:  Diagnosis Date  . Breast cancer ([place])   . Breast cancer, left breast ([place]) 11/02/14   Upper, Outer Quadrant  . Hypertension    hx  . Personal history of radiation therapy   . S/P radiation therapy 12/29/2014 through 02/09/2015       Left breast 4600 cGy  in 23 sessions left breast boost 1400 cGy in 7 sessions   . Wears glasses   . Wears partial dentures     Past Surgical History:  Procedure Laterality Date  . ABDOMINAL HYSTERECTOMY    . BREAST LUMPECTOMY Left    2016  . COLONOSCOPY    . RADIOACTIVE SEED GUIDED PARTIAL MASTECTOMY WITH AXILLARY SENTINEL LYMPH NODE BIOPSY Left 11/02/2014   Procedure: RADIOACTIVE SEED GUIDED PARTIAL MASTECTOMY WITH AXILLARY SENTINEL LYMPH NODE BIOPSY;  Surgeon: Stark Klein, MD;  Location: [facility];  Service: General;  Laterality: Left;  . RE-EXCISION OF BREAST LUMPECTOMY Left 11/17/2014   Procedure: RE-EXCISION OF LEFT BREAST LUMPECTOMY;  Surgeon: Stark Klein, MD;  Location[facility];  Service: General;  Laterality: Left;    Family History  Problem Relation Age of Onset  . Heart attack Father     Social History   Socioeconomic History  . Marital status: Married    Spouse name: Not on file  . Number of children: Not on file  . Years of education: Not on file  . Highest education level: Not on file  Occupational History  . Not on file  Social Needs  . Financial resource strain: Not on file  . Food insecurity:    Worry: Not on file    Inability: Not on file  . Transportation needs:    Medical: Not on file    Non-medical: Not on file  Tobacco Use  . Smoking status: Never Smoker  .  Smokeless tobacco: Never Used  Substance and Sexual Activity  . Alcohol use: No  . Drug use: No  . Sexual activity: Not on file  Lifestyle  . Physical activity:    Days per week: Not on file    Minutes per session: Not on file  . Stress: Not on file  Relationships  . Social connections:    Talks on phone: Not on file    Gets together: Not on file    Attends religious service: Not on file    Active member of club or organization: Not on file    Attends meetings of clubs or organizations: Not on file    Relationship status: Not on file  . Intimate partner violence:     Fear of current or ex partner: Not on file    Emotionally abused: Not on file    Physically abused: Not on file    Forced sexual activity: Not on file  Other Topics Concern  . Not on file  Social History Narrative  . Not on file    Past Medical History, Surgical history, Social history, and Family history were reviewed and updated as appropriate.   Please see review of systems for further details on the patient's review from today.   Review of Systems:  Review of Systems  Constitutional: Negative for chills, diaphoresis and fever.  HENT: Negative for trouble swallowing and voice change.   Respiratory: Negative for cough, chest tightness, shortness of breath and wheezing.   Cardiovascular: Negative for chest pain and palpitations.  Gastrointestinal: Negative for abdominal pain, constipation, diarrhea, nausea and vomiting.  Musculoskeletal: Negative for back pain and myalgias.  Skin:       Soreness in the bilateral axilla with a "hard "enlarged area in the left axilla.  Neurological: Negative for dizziness, light-headedness and headaches.    Objective:   Physical Exam:  BP (!) 137/94 (BP Location: Right Arm, Patient Position: Sitting) Comment: Liza RN is aware  Pulse 83   Temp 98.2 F (36.8 C) (Oral)   Resp 18   Ht 5\' 6"  (1.676 m)   Wt 184 lb 8 oz (83.7 kg)   SpO2 96%   BMI 29.78 kg/m  ECOG: 0  Physical Exam  Constitutional: She is oriented to person, place, and time. She appears well-developed and well-nourished. No distress.  HENT:  Head: Normocephalic and atraumatic.  Neck: Normal range of motion. Neck supple.  Cardiovascular: Normal rate, regular rhythm and normal heart sounds. Exam reveals no gallop and no friction rub.  No murmur heard. Pulmonary/Chest: Effort normal and breath sounds normal. No stridor. No respiratory distress. She has no wheezes. She has no rales.    Musculoskeletal: She exhibits no edema.  Lymphadenopathy:    She has no cervical  adenopathy.  Neurological: She is alert and oriented to person, place, and time. Coordination normal.  Skin: Skin is warm and dry. She is not diaphoretic.    Lab Review:     Component Value Date/Time   NA 139 01/12/2018 1234   NA 139 01/06/2017 1452   K 3.8 01/12/2018 1234   K 3.1 (L) 01/06/2017 1452   CL 106 01/12/2018 1234   CO2 28 01/12/2018 1234   CO2 29 01/06/2017 1452   GLUCOSE 89 01/12/2018 1234   GLUCOSE 204 (H) 01/06/2017 1452   BUN 11 01/12/2018 1234   BUN 13.7 01/06/2017 1452   CREATININE 0.79 01/12/2018 1234   CREATININE 1.0 01/06/2017 1452   CALCIUM 9.7 01/12/2018 1234  CALCIUM 9.8 01/06/2017 1452   PROT 7.1 01/12/2018 1234   PROT 7.0 01/06/2017 1452   ALBUMIN 3.8 01/12/2018 1234   ALBUMIN 3.9 01/06/2017 1452   AST 18 01/12/2018 1234   AST 21 01/06/2017 1452   ALT 9 01/12/2018 1234   ALT 11 01/06/2017 1452   ALKPHOS 79 01/12/2018 1234   ALKPHOS 72 01/06/2017 1452   BILITOT 0.4 01/12/2018 1234   BILITOT 0.43 01/06/2017 1452   GFRNONAA >60 01/12/2018 1234   GFRAA >60 01/12/2018 1234       Component Value Date/Time   WBC 5.5 01/12/2018 1234   RBC 4.74 01/12/2018 1234   HGB 14.4 01/12/2018 1234   HGB 14.1 01/06/2017 1452   HCT 43.8 01/12/2018 1234   HCT 42.1 01/06/2017 1452   PLT 220 01/12/2018 1234   PLT 211 01/06/2017 1452   MCV 92.4 01/12/2018 1234   MCV 91.0 01/06/2017 1452   MCH 30.4 01/12/2018 1234   MCHC 33.0 01/12/2018 1234   RDW 13.6 01/12/2018 1234   RDW 13.4 01/06/2017 1452   LYMPHSABS 1.7 01/12/2018 1234   LYMPHSABS 1.9 01/06/2017 1452   MONOABS 0.5 01/12/2018 1234   MONOABS 0.3 01/06/2017 1452   EOSABS 0.1 01/12/2018 1234   EOSABS 0.1 01/06/2017 1452   BASOSABS 0.0 01/12/2018 1234   BASOSABS 0.0 01/06/2017 1452   -------------------------------  Imaging from last 24 hours (if applicable):  Radiology interpretation: No results found.

## 2018-06-30 DIAGNOSIS — R7309 Other abnormal glucose: Secondary | ICD-10-CM | POA: Diagnosis not present

## 2018-06-30 DIAGNOSIS — Z0000 Encounter for general adult medical examination without abnormal findings: Secondary | ICD-10-CM | POA: Diagnosis not present

## 2018-06-30 DIAGNOSIS — Z8639 Personal history of other endocrine, nutritional and metabolic disease: Secondary | ICD-10-CM | POA: Diagnosis not present

## 2018-06-30 DIAGNOSIS — D0510 Intraductal carcinoma in situ of unspecified breast: Secondary | ICD-10-CM | POA: Diagnosis not present

## 2018-06-30 DIAGNOSIS — Z23 Encounter for immunization: Secondary | ICD-10-CM | POA: Diagnosis not present

## 2018-06-30 DIAGNOSIS — I10 Essential (primary) hypertension: Secondary | ICD-10-CM | POA: Diagnosis not present

## 2018-07-06 ENCOUNTER — Other Ambulatory Visit: Payer: Self-pay | Admitting: Oncology

## 2018-07-06 DIAGNOSIS — C50412 Malignant neoplasm of upper-outer quadrant of left female breast: Secondary | ICD-10-CM

## 2018-07-06 DIAGNOSIS — Z170 Estrogen receptor positive status [ER+]: Principal | ICD-10-CM

## 2018-07-18 ENCOUNTER — Other Ambulatory Visit: Payer: Self-pay | Admitting: Oncology

## 2018-08-21 ENCOUNTER — Other Ambulatory Visit: Payer: Self-pay | Admitting: Oncology

## 2018-08-21 DIAGNOSIS — Z853 Personal history of malignant neoplasm of breast: Secondary | ICD-10-CM

## 2018-09-14 DIAGNOSIS — Z23 Encounter for immunization: Secondary | ICD-10-CM | POA: Diagnosis not present

## 2018-09-25 ENCOUNTER — Ambulatory Visit
Admission: RE | Admit: 2018-09-25 | Discharge: 2018-09-25 | Disposition: A | Discharge status: Home / Self Care | Payer: Medicare Other | Source: Ambulatory Visit | Attending: Oncology | Admitting: Oncology

## 2018-09-25 DIAGNOSIS — R928 Other abnormal and inconclusive findings on diagnostic imaging of breast: Secondary | ICD-10-CM | POA: Diagnosis not present

## 2018-09-25 DIAGNOSIS — Z853 Personal history of malignant neoplasm of breast: Secondary | ICD-10-CM

## 2018-10-20 ENCOUNTER — Other Ambulatory Visit: Payer: Self-pay | Admitting: Oncology

## 2019-01-06 ENCOUNTER — Other Ambulatory Visit: Payer: Self-pay | Admitting: Oncology

## 2019-01-14 ENCOUNTER — Telehealth: Payer: Self-pay | Admitting: Oncology

## 2019-01-14 NOTE — Telephone Encounter (Signed)
Cancelled appt per 3/31 sch message - r.s appt to Sept - unable to reach patient . Left message and sent reminder letter in the mail.

## 2019-01-20 ENCOUNTER — Other Ambulatory Visit: Payer: Medicare Other

## 2019-01-20 ENCOUNTER — Ambulatory Visit: Payer: Medicare Other | Admitting: Oncology

## 2019-02-16 DIAGNOSIS — M25562 Pain in left knee: Secondary | ICD-10-CM | POA: Diagnosis not present

## 2019-02-16 DIAGNOSIS — M1712 Unilateral primary osteoarthritis, left knee: Secondary | ICD-10-CM | POA: Diagnosis not present

## 2019-02-16 DIAGNOSIS — M1711 Unilateral primary osteoarthritis, right knee: Secondary | ICD-10-CM | POA: Diagnosis not present

## 2019-02-16 DIAGNOSIS — M25462 Effusion, left knee: Secondary | ICD-10-CM | POA: Diagnosis not present

## 2019-03-15 DIAGNOSIS — B351 Tinea unguium: Secondary | ICD-10-CM | POA: Diagnosis not present

## 2019-03-29 ENCOUNTER — Other Ambulatory Visit: Payer: Self-pay | Admitting: Oncology

## 2019-03-29 DIAGNOSIS — Z170 Estrogen receptor positive status [ER+]: Secondary | ICD-10-CM

## 2019-03-29 DIAGNOSIS — C50412 Malignant neoplasm of upper-outer quadrant of left female breast: Secondary | ICD-10-CM

## 2019-04-01 ENCOUNTER — Other Ambulatory Visit: Payer: Self-pay | Admitting: Oncology

## 2019-04-14 DIAGNOSIS — R7309 Other abnormal glucose: Secondary | ICD-10-CM | POA: Diagnosis not present

## 2019-04-14 DIAGNOSIS — B351 Tinea unguium: Secondary | ICD-10-CM | POA: Diagnosis not present

## 2019-04-14 DIAGNOSIS — Z79899 Other long term (current) drug therapy: Secondary | ICD-10-CM | POA: Diagnosis not present

## 2019-05-17 DIAGNOSIS — Z5181 Encounter for therapeutic drug level monitoring: Secondary | ICD-10-CM | POA: Diagnosis not present

## 2019-06-25 ENCOUNTER — Other Ambulatory Visit: Payer: Self-pay | Admitting: Oncology

## 2019-06-25 DIAGNOSIS — C50412 Malignant neoplasm of upper-outer quadrant of left female breast: Secondary | ICD-10-CM

## 2019-06-25 DIAGNOSIS — Z170 Estrogen receptor positive status [ER+]: Secondary | ICD-10-CM

## 2019-07-01 ENCOUNTER — Telehealth: Payer: Self-pay | Admitting: *Deleted

## 2019-07-01 ENCOUNTER — Telehealth: Payer: Self-pay | Admitting: Oncology

## 2019-07-01 NOTE — Telephone Encounter (Signed)
Called patient regarding upcoming [facility] appointment, left a voicemail.  °

## 2019-07-04 NOTE — Progress Notes (Signed)
[name]  Telephone:(336) [phone] Fax:(336) [phone]     ID: Linda Singh DOB: [birth year]  MR#: [id number]  RAQ#:[id number]  Patient Care Team: London Pepper, MD as PCP - General (Family Medicine) Stark Klein, MD as Consulting Physician (General Surgery) Arloa Koh, MD (Inactive) as Consulting Physician (Radiation Oncology) Magrinat, Virgie Dad, MD as Consulting Physician (Oncology) Holley Bouche, NP (Inactive) as Nurse Practitioner (Nurse Practitioner) Rockwell Germany, RN as Registered Nurse Mauro Kaufmann, RN as Registered Nurse OTHER MD:   CHIEF COMPLAINT: Ductal carcinoma in situ  CURRENT TREATMENT: Anastrozole   BREAST CANCER HISTORY: From the original intake note:  Linda Singh had routine screening mammography at the breast Center 09/20/2014 going showing her breast density to be category B. In the left breast new calcifications were noted, and these were further evaluated with left diagnostic mammography 09/27/2014. The calcifications when the upper outer quadrant, and were mildly irregular and linear. Biopsy was performed 10/11/2014 and showed ([id number]) ductal carcinoma in situ, grade 2, estrogen receptor 100% positive, progesterone receptor 30% positive, both with strong staining intensity.  MRI was scheduled but the patient was unable to tolerate it.  Her subsequent history is as detailed below   INTERVAL HISTORY: [name] returns today for follow-up and treatment of her estrogen receptor positive breast cancer. She was last seen here on 01/19/2018.   She continues on anastrozole.  She tolerates this well, with no significant hot flashes, arthralgias, myalgias, or vaginal dryness problems  Gweneth's last bone density screening on 12/21/2015, showed a T-score of -0.6, which is considered normal.    Since her last visit here, she underwent a digital diagnostic bilateral mammogram with tomography on 09/25/2018 showing: Breast Density Category B.  There is no mammographic evidence of malignancy.    REVIEW OF SYSTEMS: Linda Singh got a fit bit and this is really motivated her.  She is doing 10,000 steps a day, every day.  She is developed a little bit of discomfort in both axillae, left greater than right and she was very concerned about this.  She is very proud that they had their first grandson.  They already had 3 granddaughters.  Aside from all these issues detailed review of systems today was stable   PAST MEDICAL HISTORY: Past Medical History:  Diagnosis Date  . Breast cancer ([place])   . Breast cancer, left breast ([place]) 11/02/14   Upper, Outer Quadrant  . Hypertension    hx  . Personal history of radiation therapy   . S/P radiation therapy 12/29/2014 through 02/09/2015       Left breast 4600 cGy in 23 sessions left breast boost 1400 cGy in 7 sessions   . Wears glasses   . Wears partial dentures     PAST SURGICAL HISTORY: Past Surgical History:  Procedure Laterality Date  . ABDOMINAL HYSTERECTOMY    . BREAST LUMPECTOMY Left    2016  . COLONOSCOPY    . RADIOACTIVE SEED GUIDED PARTIAL MASTECTOMY WITH AXILLARY SENTINEL LYMPH NODE BIOPSY Left 11/02/2014   Procedure: RADIOACTIVE SEED GUIDED PARTIAL MASTECTOMY WITH AXILLARY SENTINEL LYMPH NODE BIOPSY;  Surgeon: Stark Klein, MD;  Location: [facility];  Service: General;  Laterality: Left;  . RE-EXCISION OF BREAST LUMPECTOMY Left 11/17/2014   Procedure: RE-EXCISION OF LEFT BREAST LUMPECTOMY;  Surgeon: Stark Klein, MD;  Location: [facility];  Service: General;  Laterality: Left;    FAMILY HISTORY Family History  Problem Relation Age of Onset  . Heart attack Father  The patient's father died in an automobile accident at age 23. The patient's mother died at age 43. The patient had 2 brothers, 6 sisters. There is no history of breast or ovarian cancer in the family.  GYNECOLOGIC HISTORY:  No LMP  recorded. Patient is postmenopausal. Menarche age 23. First live birth age 23. The patient is GX P3. She status post total abdominal hysterectomy with bilateral salpingo-oophorectomy. She did not take hormone replacement. 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 Her son [name] works as a Psychologist, sport and exercise in [place]. Her son [name] is Technical brewer. of [employer] in [place]. The patient has 3 granddaughters and one grandson. She attends [employer]    ADVANCED DIRECTIVES: Not in place   HEALTH MAINTENANCE: Social History   Tobacco Use  . Smoking status: Never Smoker  . Smokeless tobacco: Never Used  Substance Use Topics  . Alcohol use: No  . Drug use: No     Colonoscopy: 2015/[facility]  PAP: Status post hysterectomy  Bone density:  Lipid panel:  Allergies  Allergen Reactions  . Amoxicillin     Does not know  . Contrast Media [Iodinated Diagnostic Agents] Nausea And Vomiting    Current Outpatient Medications  Medication Sig Dispense Refill  . anastrozole (ARIMIDEX) 1 MG tablet Take 1 tablet by mouth once daily 90 tablet 0  . calcium carbonate (OS-CAL) 600 MG TABS tablet Take 600 mg by mouth 2 (two) times daily with a meal.    . hydrochlorothiazide (HYDRODIURIL) 25 MG tablet Take 25 mg by mouth daily.    Marland Kitchen ibuprofen (ADVIL,MOTRIN) 200 MG tablet Take 200 mg by mouth every 6 (six) hours as needed.    Marland Kitchen omega-3 acid ethyl esters (LOVAZA) 1 G capsule Take 1 g by mouth daily.    . potassium chloride (MICRO-K) 10 MEQ CR capsule TAKE 1 CAPSULE (10 MEQ TOTAL) BY MOUTH 2 (TWO) TIMES DAILY 180 capsule 0   No current facility-administered medications for this visit.     OBJECTIVE: Older African-American woman in no acute distress  Vitals:   07/05/19 1440  BP: (!) 144/92  Pulse: 71  Resp: 18  Temp: 97.8 F (36.6 C)   SpO2: 97%   Wt Readings from Last 3 Encounters:  07/05/19 179 lb 1.6 oz (81.2 kg)  05/26/18 184 lb 8 oz (83.7 kg)  01/19/18 182 lb (82.6 kg)   Body mass index is 28.91 kg/m.    ECOG FS:1 - Symptomatic but completely ambulatory  Ocular: Sclerae unicteric, pupils round and equal Ear-nose-throat: Wearing a mask Lymphatic: No cervical or supraclavicular adenopathy Lungs no rales or rhonchi Heart regular rate and rhythm Abd soft, nontender, positive bowel sounds MSK no focal spinal tenderness, no joint edema Neuro: non-focal, well-oriented, appropriate affect Breasts: The right breast is unremarkable.  The left breast has undergone lumpectomy and radiation.  There is no evidence of disease recurrence.  Careful examination of both axillae show no suspicious findings  LAB RESULTS:  CMP     Component Value Date/Time   NA 139 01/12/2018 1234   NA 139 01/06/2017 1452   K 3.8 01/12/2018 1234   K 3.1 (L) 01/06/2017 1452   CL 106 01/12/2018 1234   CO2 28 01/12/2018 1234   CO2 29 01/06/2017 1452   GLUCOSE 89 01/12/2018 1234   GLUCOSE 204 (H) 01/06/2017 1452  BUN 11 01/12/2018 1234   BUN 13.7 01/06/2017 1452   CREATININE 0.79 01/12/2018 1234   CREATININE 1.0 01/06/2017 1452   CALCIUM 9.7 01/12/2018 1234   CALCIUM 9.8 01/06/2017 1452   PROT 7.1 01/12/2018 1234   PROT 7.0 01/06/2017 1452   ALBUMIN 3.8 01/12/2018 1234   ALBUMIN 3.9 01/06/2017 1452   AST 18 01/12/2018 1234   AST 21 01/06/2017 1452   ALT 9 01/12/2018 1234   ALT 11 01/06/2017 1452   ALKPHOS 79 01/12/2018 1234   ALKPHOS 72 01/06/2017 1452   BILITOT 0.4 01/12/2018 1234   BILITOT 0.43 01/06/2017 1452   GFRNONAA >60 01/12/2018 1234   GFRAA >60 01/12/2018 1234    INo results found for: SPEP, UPEP  Lab Results  Component Value Date   WBC 5.5 01/12/2018   NEUTROABS 3.1 01/12/2018   HGB 14.4 01/12/2018   HCT 43.8 01/12/2018   MCV 92.4 01/12/2018   PLT 220 01/12/2018      Chemistry      Component Value  Date/Time   NA 139 01/12/2018 1234   NA 139 01/06/2017 1452   K 3.8 01/12/2018 1234   K 3.1 (L) 01/06/2017 1452   CL 106 01/12/2018 1234   CO2 28 01/12/2018 1234   CO2 29 01/06/2017 1452   BUN 11 01/12/2018 1234   BUN 13.7 01/06/2017 1452   CREATININE 0.79 01/12/2018 1234   CREATININE 1.0 01/06/2017 1452      Component Value Date/Time   CALCIUM 9.7 01/12/2018 1234   CALCIUM 9.8 01/06/2017 1452   ALKPHOS 79 01/12/2018 1234   ALKPHOS 72 01/06/2017 1452   AST 18 01/12/2018 1234   AST 21 01/06/2017 1452   ALT 9 01/12/2018 1234   ALT 11 01/06/2017 1452   BILITOT 0.4 01/12/2018 1234   BILITOT 0.43 01/06/2017 1452       No results found for: LABCA2  No components found for: LABCA125  No results for input(s): INR in the last 168 hours.  Urinalysis No results found for: COLORURINE, APPEARANCEUR, LABSPEC, PHURINE, GLUCOSEU, HGBUR, BILIRUBINUR, KETONESUR, PROTEINUR, UROBILINOGEN, NITRITE, LEUKOCYTESUR  STUDIES: No results found.   ASSESSMENT: 78 y.o. [place] woman status post left breast upper outer quadrant biopsy 10/11/2014 showing ductal carcinoma in situ, grade 2, estrogen and progesterone receptor positive  (1) status post left lumpectomy and sentinel lymph node sampling 11/02/2014 for a pT1a pN0, stage IA invasive ductal carcinoma, grade 2, estrogen receptor 100% positive, progesterone receptor 76% positive, with an MIB-1 of 17%, and no HER-2 amplification. Margins were focally involved by ductal carcinoma in situ  (a) additional surgery 11/17/2014 showed DCIS still close but now negative margins   (2) radiation oncology 12/29/2014 through 02/09/2015:   Left breast 4600 cGy in 23 sessions left breast boost 1400 cGy in 7 sessions   (3) anastrozole started 02/16/2015  (a) bone density February 2015 was normal.  (b) bone density 12/21/2015 was normal with a T score of -0.6  PLAN: Ms. Cumpian is now  4-1/2 years out from definitive surgery for her breast cancer with no evidence of disease recurrence.  This is very favorable.  She is tolerating anastrozole well and the plan will be to continue that into April 2021.  I will see her at that time and she will be ready to "graduate" then.  I reassured her that the discomfort she is feeling in both axilla is not related to cancer.  On the left side and will be related to the surgery and  radiation she had previously.  She is keeping appropriate pandemic precautions  She knows to call for any other issues that may develop before the next visit.   Magrinat, Virgie Dad, MD  07/05/19 2:59 PM Medical Oncology and Hematology [facility] [place], [place] [address] Tel. [phone]    Fax. [phone]  I, Jacqualyn Posey am acting as a Education administrator for Chauncey Cruel, MD.   I, Lurline Del MD, have reviewed the above documentation for accuracy and completeness, and I agree with the above.

## 2019-07-05 ENCOUNTER — Other Ambulatory Visit: Payer: Medicare Other

## 2019-07-05 ENCOUNTER — Inpatient Hospital Stay: Payer: Medicare Other | Attending: Oncology | Admitting: Oncology

## 2019-07-05 ENCOUNTER — Other Ambulatory Visit: Payer: Self-pay

## 2019-07-05 VITALS — BP 144/92 | HR 71 | Temp 97.8°F | Resp 18 | Wt 179.1 lb

## 2019-07-05 DIAGNOSIS — Z170 Estrogen receptor positive status [ER+]: Secondary | ICD-10-CM

## 2019-07-05 DIAGNOSIS — C50412 Malignant neoplasm of upper-outer quadrant of left female breast: Secondary | ICD-10-CM

## 2019-07-05 MED ORDER — ANASTROZOLE 1 MG PO TABS: 1.0000 mg | ORAL_TABLET | Freq: Every day | ORAL | 0 refills | Status: DC

## 2019-07-06 ENCOUNTER — Telehealth: Payer: Self-pay | Admitting: Oncology

## 2019-07-06 NOTE — Telephone Encounter (Signed)
I talk with patient regarding schedule  

## 2019-07-15 ENCOUNTER — Other Ambulatory Visit: Payer: Self-pay | Admitting: Oncology

## 2019-07-26 DIAGNOSIS — Z111 Encounter for screening for respiratory tuberculosis: Secondary | ICD-10-CM | POA: Diagnosis not present

## 2019-07-27 ENCOUNTER — Other Ambulatory Visit: Payer: Self-pay | Admitting: Family Medicine

## 2019-07-27 DIAGNOSIS — Z0000 Encounter for general adult medical examination without abnormal findings: Secondary | ICD-10-CM | POA: Diagnosis not present

## 2019-07-27 DIAGNOSIS — D0510 Intraductal carcinoma in situ of unspecified breast: Secondary | ICD-10-CM | POA: Diagnosis not present

## 2019-07-27 DIAGNOSIS — M8580 Other specified disorders of bone density and structure, unspecified site: Secondary | ICD-10-CM | POA: Diagnosis not present

## 2019-07-27 DIAGNOSIS — Z1211 Encounter for screening for malignant neoplasm of colon: Secondary | ICD-10-CM | POA: Diagnosis not present

## 2019-07-27 DIAGNOSIS — Z8639 Personal history of other endocrine, nutritional and metabolic disease: Secondary | ICD-10-CM | POA: Diagnosis not present

## 2019-07-27 DIAGNOSIS — I10 Essential (primary) hypertension: Secondary | ICD-10-CM | POA: Diagnosis not present

## 2019-07-27 DIAGNOSIS — R7303 Prediabetes: Secondary | ICD-10-CM | POA: Diagnosis not present

## 2019-07-28 DIAGNOSIS — Z23 Encounter for immunization: Secondary | ICD-10-CM | POA: Diagnosis not present

## 2019-08-10 DIAGNOSIS — Z86010 Personal history of colonic polyps: Secondary | ICD-10-CM | POA: Diagnosis not present

## 2019-08-24 ENCOUNTER — Other Ambulatory Visit: Payer: Self-pay | Admitting: Oncology

## 2019-08-24 DIAGNOSIS — Z853 Personal history of malignant neoplasm of breast: Secondary | ICD-10-CM

## 2019-08-24 DIAGNOSIS — Z9889 Other specified postprocedural states: Secondary | ICD-10-CM

## 2019-09-27 ENCOUNTER — Other Ambulatory Visit: Payer: Self-pay

## 2019-09-27 ENCOUNTER — Ambulatory Visit
Admission: RE | Admit: 2019-09-27 | Discharge: 2019-09-27 | Disposition: A | Discharge status: Home / Self Care | Payer: Medicare Other | Source: Ambulatory Visit | Attending: Oncology | Admitting: Oncology

## 2019-09-27 DIAGNOSIS — Z9889 Other specified postprocedural states: Secondary | ICD-10-CM

## 2019-09-27 DIAGNOSIS — Z853 Personal history of malignant neoplasm of breast: Secondary | ICD-10-CM

## 2019-09-29 ENCOUNTER — Other Ambulatory Visit: Payer: Self-pay | Admitting: Oncology

## 2019-09-29 DIAGNOSIS — Z170 Estrogen receptor positive status [ER+]: Secondary | ICD-10-CM

## 2019-09-29 DIAGNOSIS — C50412 Malignant neoplasm of upper-outer quadrant of left female breast: Secondary | ICD-10-CM

## 2019-10-14 ENCOUNTER — Other Ambulatory Visit: Payer: Self-pay | Admitting: Oncology

## 2019-10-28 DIAGNOSIS — Z1159 Encounter for screening for other viral diseases: Secondary | ICD-10-CM | POA: Diagnosis not present

## 2019-11-02 DIAGNOSIS — D123 Benign neoplasm of transverse colon: Secondary | ICD-10-CM | POA: Diagnosis not present

## 2019-11-02 DIAGNOSIS — Z86010 Personal history of colonic polyps: Secondary | ICD-10-CM | POA: Diagnosis not present

## 2019-11-03 NOTE — Telephone Encounter (Signed)
No entry 

## 2019-11-05 DIAGNOSIS — D123 Benign neoplasm of transverse colon: Secondary | ICD-10-CM | POA: Diagnosis not present

## 2019-11-10 ENCOUNTER — Ambulatory Visit: Payer: Medicare Other

## 2019-11-15 ENCOUNTER — Other Ambulatory Visit: Payer: Self-pay

## 2019-11-15 ENCOUNTER — Ambulatory Visit
Admission: RE | Admit: 2019-11-15 | Discharge: 2019-11-15 | Disposition: A | Discharge status: Home / Self Care | Payer: Medicare Other | Source: Ambulatory Visit | Attending: Family Medicine | Admitting: Family Medicine

## 2019-11-15 DIAGNOSIS — Z780 Asymptomatic menopausal state: Secondary | ICD-10-CM | POA: Diagnosis not present

## 2019-11-15 DIAGNOSIS — M8580 Other specified disorders of bone density and structure, unspecified site: Secondary | ICD-10-CM

## 2019-11-15 DIAGNOSIS — M85852 Other specified disorders of bone density and structure, left thigh: Secondary | ICD-10-CM | POA: Diagnosis not present

## 2019-12-25 ENCOUNTER — Other Ambulatory Visit: Payer: Self-pay | Admitting: Oncology

## 2019-12-25 DIAGNOSIS — C50412 Malignant neoplasm of upper-outer quadrant of left female breast: Secondary | ICD-10-CM

## 2019-12-25 DIAGNOSIS — Z170 Estrogen receptor positive status [ER+]: Secondary | ICD-10-CM

## 2020-01-03 DIAGNOSIS — L509 Urticaria, unspecified: Secondary | ICD-10-CM | POA: Diagnosis not present

## 2020-01-04 ENCOUNTER — Other Ambulatory Visit: Payer: Self-pay | Admitting: Oncology

## 2020-01-12 DIAGNOSIS — L309 Dermatitis, unspecified: Secondary | ICD-10-CM | POA: Diagnosis not present

## 2020-01-31 NOTE — Progress Notes (Signed)
[place]  Telephone:(336) [phone] Fax:(336) [phone]     ID: Linda Singh DOB: Sep 04, 1941  MR#: [id number]  JOI#:[id number]  Patient Care Team: London Pepper, MD as PCP - General (Family Medicine) Stark Klein, MD as Consulting Physician (General Surgery) Arloa Koh, MD (Inactive) as Consulting Physician (Radiation Oncology) Jasneet Schobert, Virgie Dad, MD as Consulting Physician (Oncology) Holley Bouche, NP (Inactive) as Nurse Practitioner (Nurse Practitioner) Rockwell Germany, RN as Registered Nurse Mauro Kaufmann, RN as Registered Nurse OTHER MD:   CHIEF COMPLAINT: Ductal carcinoma in situ  CURRENT TREATMENT: Completing 5 years of anastrozole   INTERVAL HISTORY: Linda Singh returns today for follow-up of her estrogen receptor positive breast cancer.   She is ready to come off anastrozole.  S she tolerated this remarkably well, with no significant hot flashes or vaginal dryness issues.  She also did not have significant problems with arthralgias or myalgias.  Since her last visit here, she underwent a digital diagnostic bilateral mammogram with tomography on 09/27/2019 showing: Breast Density Category B. There is no mammographic evidence of malignancy.   She also underwent bone density screening on 11/15/2019 showing a T-score of -1.2, which is considered osteopenic.    REVIEW OF SYSTEMS: Linda Singh has had both her Covid vaccines.  She tells me her son and his wife did have the virus infection but did well with it.  They are planning to get vaccinated as soon as 3 months passed from their infection.  He had an episode of urticaria which took her to urgent care but that cleared with some steroids.  She has some discomfort in the left axilla.  She wonders if she needs to change her deodorant.  Aside from that a detailed review of systems today was stable   BREAST CANCER HISTORY: From the original intake note:  Linda Singh had routine screening mammography at the breast  Center 09/20/2014 going showing her breast density to be category B. In the left breast new calcifications were noted, and these were further evaluated with left diagnostic mammography 09/27/2014. The calcifications when the upper outer quadrant, and were mildly irregular and linear. Biopsy was performed 10/11/2014 and showed ([id number]) ductal carcinoma in situ, grade 2, estrogen receptor 100% positive, progesterone receptor 30% positive, both with strong staining intensity.  MRI was scheduled but the patient was unable to tolerate it.  Her subsequent history is as detailed below   PAST MEDICAL HISTORY: Past Medical History:  Diagnosis Date  . Breast cancer ([place])   . Breast cancer, left breast ([place]) 11/02/14   Upper, Outer Quadrant  . Hypertension    hx  . Personal history of radiation therapy   . S/P radiation therapy 12/29/2014 through 02/09/2015       Left breast 4600 cGy in 23 sessions left breast boost 1400 cGy in 7 sessions   . Wears glasses   . Wears partial dentures     PAST SURGICAL HISTORY: Past Surgical History:  Procedure Laterality Date  . ABDOMINAL HYSTERECTOMY    . BREAST LUMPECTOMY Left    2016  . COLONOSCOPY    . RADIOACTIVE SEED GUIDED PARTIAL MASTECTOMY WITH AXILLARY SENTINEL LYMPH NODE BIOPSY Left 11/02/2014   Procedure: RADIOACTIVE SEED GUIDED PARTIAL MASTECTOMY WITH AXILLARY SENTINEL LYMPH NODE BIOPSY;  Surgeon: Stark Klein, MD;  Location: [facility];  Service: General;  Laterality: Left;  . RE-EXCISION OF BREAST LUMPECTOMY Left 11/17/2014   Procedure: RE-EXCISION OF LEFT BREAST LUMPECTOMY;  Surgeon: Stark Klein, MD;  Location[facility];  Service: General;  Laterality: Left;    FAMILY HISTORY Family History  Problem Relation Age of Onset  . Heart attack Father    The patient's father died in an automobile accident at age 20. The patient's mother died at age 50. The  patient had 2 brothers, 6 sisters. There is no history of breast or ovarian cancer in the family.   GYNECOLOGIC HISTORY:  No LMP recorded. Patient is postmenopausal. Menarche age 4. First live birth age 79. The patient is GX P3. She status post total abdominal hysterectomy with bilateral salpingo-oophorectomy. She did not take hormone replacement. She did take oral contraceptives remotely for approximately 14 years with no complications   SOCIAL HISTORY:  The patient is a retired Music therapist. Her husband Linda Singh is an Arboriculturist. Her son Linda Singh works as a Psychologist, sport and exercise in [place]. Her son Linda Singh is Technical brewer. of [employer] in [place]. The patient has 3 granddaughters and one grandson. She attends [employer]    ADVANCED DIRECTIVES: Not in place   HEALTH MAINTENANCE: Social History   Tobacco Use  . Smoking status: Never Smoker  . Smokeless tobacco: Never Used  Substance Use Topics  . Alcohol use: No  . Drug use: No     Colonoscopy: 2015/[facility]  PAP: Status post hysterectomy  Bone density:  Lipid panel:  Allergies  Allergen Reactions  . Amoxicillin     Does not know  . Contrast Media [Iodinated Diagnostic Agents] Nausea And Vomiting    Current Outpatient Medications  Medication Sig Dispense Refill  . calcium carbonate (OS-CAL) 600 MG TABS tablet Take 600 mg by mouth 2 (two) times daily with a meal.    . cholecalciferol (VITAMIN D3) 25 MCG (1000 UNIT) tablet Take 1 tablet (1,000 Units total) by mouth daily. 90 tablet 6  . hydrochlorothiazide (HYDRODIURIL) 25 MG tablet Take 25 mg by mouth daily.    Marland Kitchen ibuprofen (ADVIL,MOTRIN) 200 MG tablet Take 200 mg by mouth every 6 (six) hours as needed.    Marland Kitchen omega-3 acid ethyl esters (LOVAZA) 1 G capsule Take 1 g by mouth daily.    . potassium chloride (MICRO-K) 10 MEQ CR capsule TAKE 1 CAPSULE (10 MEQ TOTAL) BY MOUTH 2 (TWO) TIMES DAILY 180 capsule 0   No current facility-administered medications for this  visit.    OBJECTIVE:  African-American woman in no acute distress  Vitals:   02/01/20 1532  BP: 127/89  Pulse: 81  Resp: 18  Temp: 98.3 F (36.8 C)  SpO2: 97%   Wt Readings from Last 3 Encounters:  02/01/20 181 lb 1.6 oz (82.1 kg)  07/05/19 179 lb 1.6 oz (81.2 kg)  05/26/18 184 lb 8 oz (83.7 kg)   Body mass index is 29.23 kg/m.    ECOG FS:1 - Symptomatic but completely ambulatory  Sclerae unicteric, EOMs intact Wearing a mask No cervical or supraclavicular adenopathy Lungs no rales or rhonchi Heart regular rate and rhythm Abd soft, nontender, positive bowel sounds MSK no focal spinal tenderness, no upper extremity lymphedema Neuro: nonfocal, well oriented, appropriate affect Breasts: The right breast is benign.  The left breast is status post lumpectomy and radiation.  There is some coarsening of the skin and minimal distortion of the breast contour but no evidence of disease recurrence.  Both axillae are benign.   LAB RESULTS:  CMP     Component Value Date/Time   NA 139 01/12/2018 1234   NA 139 01/06/2017 1452  K 3.8 01/12/2018 1234   K 3.1 (L) 01/06/2017 1452   CL 106 01/12/2018 1234   CO2 28 01/12/2018 1234   CO2 29 01/06/2017 1452   GLUCOSE 89 01/12/2018 1234   GLUCOSE 204 (H) 01/06/2017 1452   BUN 11 01/12/2018 1234   BUN 13.7 01/06/2017 1452   CREATININE 0.79 01/12/2018 1234   CREATININE 1.0 01/06/2017 1452   CALCIUM 9.7 01/12/2018 1234   CALCIUM 9.8 01/06/2017 1452   PROT 7.1 01/12/2018 1234   PROT 7.0 01/06/2017 1452   ALBUMIN 3.8 01/12/2018 1234   ALBUMIN 3.9 01/06/2017 1452   AST 18 01/12/2018 1234   AST 21 01/06/2017 1452   ALT 9 01/12/2018 1234   ALT 11 01/06/2017 1452   ALKPHOS 79 01/12/2018 1234   ALKPHOS 72 01/06/2017 1452   BILITOT 0.4 01/12/2018 1234   BILITOT 0.43 01/06/2017 1452   GFRNONAA >60 01/12/2018 1234   GFRAA >60 01/12/2018 1234    INo results found for: SPEP, UPEP  Lab Results  Component Value Date   WBC 5.5  01/12/2018   NEUTROABS 3.1 01/12/2018   HGB 14.4 01/12/2018   HCT 43.8 01/12/2018   MCV 92.4 01/12/2018   PLT 220 01/12/2018      Chemistry      Component Value Date/Time   NA 139 01/12/2018 1234   NA 139 01/06/2017 1452   K 3.8 01/12/2018 1234   K 3.1 (L) 01/06/2017 1452   CL 106 01/12/2018 1234   CO2 28 01/12/2018 1234   CO2 29 01/06/2017 1452   BUN 11 01/12/2018 1234   BUN 13.7 01/06/2017 1452   CREATININE 0.79 01/12/2018 1234   CREATININE 1.0 01/06/2017 1452      Component Value Date/Time   CALCIUM 9.7 01/12/2018 1234   CALCIUM 9.8 01/06/2017 1452   ALKPHOS 79 01/12/2018 1234   ALKPHOS 72 01/06/2017 1452   AST 18 01/12/2018 1234   AST 21 01/06/2017 1452   ALT 9 01/12/2018 1234   ALT 11 01/06/2017 1452   BILITOT 0.4 01/12/2018 1234   BILITOT 0.43 01/06/2017 1452      No results found for: LABCA2  No components found for: LABCA125  No results for input(s): INR in the last 168 hours.  Urinalysis No results found for: COLORURINE, APPEARANCEUR, LABSPEC, PHURINE, GLUCOSEU, HGBUR, BILIRUBINUR, KETONESUR, PROTEINUR, UROBILINOGEN, NITRITE, LEUKOCYTESUR   STUDIES: No results found.   ASSESSMENT: 79 y.o. [place] woman status post left breast upper outer quadrant biopsy 10/11/2014 showing ductal carcinoma in situ, grade 2, estrogen and progesterone receptor positive  (1) status post left lumpectomy and sentinel lymph node sampling 11/02/2014 for a pT1a pN0, stage IA invasive ductal carcinoma, grade 2, estrogen receptor 100% positive, progesterone receptor 76% positive, with an MIB-1 of 17%, and no HER-2 amplification. Margins were focally involved by ductal carcinoma in situ  (a) additional surgery 11/17/2014 showed DCIS still close but now negative margins   (2) radiation oncology 12/29/2014 through 02/09/2015:   Left breast 4600 cGy in 23 sessions left breast boost 1400 cGy in 7 sessions   (3)  anastrozole started 02/16/2015, discontinued April 2021  (a) bone density February 2015 was normal.  (b) bone density 12/21/2015 was normal with a T score of -0.6  (c) bone density 11/15/2019 showed a T score of -1.2   PLAN: Rosita is now a little over 5 years out from definitive surgery for her breast cancer with no evidence of disease recurrence.  This is very favorable.  She has  completed 5 years of tamoxifen.  Given the fact that her cancer was caught very early, there is likely to be no benefit from continuing anastrozole for another 2 years.  Accordingly she is going off that medication.  At this point I am comfortable releasing her to her primary care physician's care.  All she will need in terms of breast cancer follow-up is a yearly physician breast exam and yearly screening mammography  I will be glad to see Ms. Malacara again at any point in the future if and when the need arises but as of now are making no further routine appointments for her here  Total encounter time 25 minutes.*  Winnie Umali, Virgie Dad, MD  02/01/20 3:55 PM Medical Oncology and Hematology [facility] [place], [place] [address] Tel. [phone]    Fax. [phone]   I, Wilburn Mylar, am acting as scribe for Dr. Virgie Dad. Daniel Ritthaler.  I, Lurline Del MD, have reviewed the above documentation for accuracy and completeness, and I agree with the above.   *Total Encounter Time as defined by the Centers for Medicare and Medicaid Services includes, in addition to the face-to-face time of a patient visit (documented in the note above) non-face-to-face time: obtaining and reviewing outside history, ordering and reviewing medications, tests or procedures, care coordination (communications with other health care professionals or caregivers) and documentation in the medical record.

## 2020-02-01 ENCOUNTER — Inpatient Hospital Stay: Payer: Medicare Other | Attending: Oncology | Admitting: Oncology

## 2020-02-01 ENCOUNTER — Other Ambulatory Visit: Payer: Self-pay

## 2020-02-01 VITALS — BP 127/89 | HR 81 | Temp 98.3°F | Resp 18 | Ht 66.0 in | Wt 181.1 lb

## 2020-02-01 DIAGNOSIS — C50412 Malignant neoplasm of upper-outer quadrant of left female breast: Secondary | ICD-10-CM | POA: Diagnosis not present

## 2020-02-01 DIAGNOSIS — Z170 Estrogen receptor positive status [ER+]: Secondary | ICD-10-CM | POA: Diagnosis not present

## 2020-02-01 DIAGNOSIS — Z86000 Personal history of in-situ neoplasm of breast: Secondary | ICD-10-CM | POA: Insufficient documentation

## 2020-02-01 DIAGNOSIS — M8580 Other specified disorders of bone density and structure, unspecified site: Secondary | ICD-10-CM | POA: Insufficient documentation

## 2020-02-01 MED ORDER — VITAMIN D 25 MCG (1000 UNIT) PO TABS: 1000.0000 [IU] | ORAL_TABLET | Freq: Every day | ORAL | 6 refills | Status: DC

## 2020-02-02 ENCOUNTER — Telehealth: Payer: Self-pay | Admitting: Oncology

## 2020-02-02 NOTE — Telephone Encounter (Signed)
No changes made to pt's schedule per 4/20 los.

## 2020-04-03 ENCOUNTER — Other Ambulatory Visit: Payer: Self-pay | Admitting: Oncology

## 2020-05-18 DIAGNOSIS — I10 Essential (primary) hypertension: Secondary | ICD-10-CM | POA: Diagnosis not present

## 2020-05-29 DIAGNOSIS — R002 Palpitations: Secondary | ICD-10-CM | POA: Diagnosis not present

## 2020-05-29 DIAGNOSIS — I10 Essential (primary) hypertension: Secondary | ICD-10-CM | POA: Diagnosis not present

## 2020-06-06 ENCOUNTER — Ambulatory Visit (INDEPENDENT_AMBULATORY_CARE_PROVIDER_SITE_OTHER): Payer: Medicare Other | Admitting: Cardiovascular Disease

## 2020-06-06 ENCOUNTER — Encounter: Payer: Self-pay | Admitting: Cardiovascular Disease

## 2020-06-06 ENCOUNTER — Other Ambulatory Visit: Payer: Self-pay

## 2020-06-06 VITALS — BP 118/84 | HR 88 | Ht 66.0 in | Wt 175.4 lb

## 2020-06-06 DIAGNOSIS — R9431 Abnormal electrocardiogram [ECG] [EKG]: Secondary | ICD-10-CM | POA: Diagnosis not present

## 2020-06-06 DIAGNOSIS — I491 Atrial premature depolarization: Secondary | ICD-10-CM | POA: Diagnosis not present

## 2020-06-06 NOTE — Progress Notes (Signed)
Cardiology Office Note:    Date:  06/06/2020   ID:  Linda Singh, Linda Singh 02/17/1941, MRN [id number]  PCP:  London Pepper, MD  [facility] Cardiologist:  Wyvonne Lenz  [facility] Electrophysiologist:  None   Referring MD: London Pepper, MD   Chief Complaint  Patient presents with  . Palpitations     Aug. 24, 2021:    Linda Singh is a 79 y.o. female with a hx of palpitations.  We were asked to see her today by Dr. Orland Mustard for further evaluation of her palpitations   Her sister recently passed away from [employer]. She has developed palpitations and HTN during her illness and after her passing .   Has been under lots of stress  BP was elevated recently and she was started on Amlodipine  Has been on HCTZ for years   When she went to see her primary MD,  She has having lots of PACs .  She could not feel the irreg HR and felt fine.   Past Medical History:  Diagnosis Date  . Breast cancer ([place])   . Breast cancer, left breast ([place]) 11/02/14   Upper, Outer Quadrant  . Constipation   . DCIS (ductal carcinoma in situ) of breast   . Hearing difficulty   . History of colon polyps   . Hyperlipidemia   . Hypertension    hx  . Osteopenia   . Personal history of radiation therapy   . Pre-diabetes   . S/P radiation therapy 12/29/2014 through 02/09/2015       Left breast 4600 cGy in 23 sessions left breast boost 1400 cGy in 7 sessions   . Wears glasses   . Wears partial dentures     Past Surgical History:  Procedure Laterality Date  . ABDOMINAL HYSTERECTOMY    . BREAST LUMPECTOMY Left    2016  . COLONOSCOPY    . RADIOACTIVE SEED GUIDED PARTIAL MASTECTOMY WITH AXILLARY SENTINEL LYMPH NODE BIOPSY Left 11/02/2014   Procedure: RADIOACTIVE SEED GUIDED PARTIAL MASTECTOMY WITH AXILLARY SENTINEL LYMPH NODE BIOPSY;  Surgeon: Stark Klein, MD;  Location: [facility];  Service: General;  Laterality: Left;    . RE-EXCISION OF BREAST LUMPECTOMY Left 11/17/2014   Procedure: RE-EXCISION OF LEFT BREAST LUMPECTOMY;  Surgeon: Stark Klein, MD;  Location: [facility];  Service: General;  Laterality: Left;    Current Medications: Current Meds  Medication Sig  . AMLODIPINE BESYLATE PO Take 5 mg by mouth.  . Ascorbic Acid (VITAMIN C) 1000 MG tablet Take 1,000 mg by mouth daily.  Marland Kitchen b complex vitamins tablet Take 1 tablet by mouth daily.  . Biotin 10 MG TABS Take by mouth.  . calcium carbonate (OS-CAL) 600 MG TABS tablet Take 600 mg by mouth 2 (two) times daily with a meal.  . Calcium Carbonate-Vitamin D (CALCIUM 600+D PO) Take by mouth.  . cholecalciferol (VITAMIN D3) 25 MCG (1000 UNIT) tablet Take 1 tablet (1,000 Units total) by mouth daily.  . hydrochlorothiazide (HYDRODIURIL) 25 MG tablet Take 25 mg by mouth daily.  Marland Kitchen ibuprofen (ADVIL,MOTRIN) 200 MG tablet Take 200 mg by mouth every 6 (six) hours as needed.  . Magnesium 400 MG CAPS Take by mouth.  . Omega-3 1000 MG CAPS Take 1 capsule by mouth daily.  . silver sulfADIAZINE (SILVADENE) 1 % cream Apply 1 application topically daily.  . Turmeric 500 MG CAPS Take by mouth.  . Zinc 100 MG TABS Take by mouth.  . [DISCONTINUED] potassium chloride (  MICRO-K) 10 MEQ CR capsule TAKE 1 CAPSULE (10 MEQ TOTAL) BY MOUTH 2 (TWO) TIMES DAILY     Allergies:   Amoxicillin and Contrast media [iodinated diagnostic agents]   Social History   Socioeconomic History  . Marital status: Married    Spouse name: Not on file  . Number of children: Not on file  . Years of education: Not on file  . Highest education level: Not on file  Occupational History  . Not on file  Tobacco Use  . Smoking status: Never Smoker  . Smokeless tobacco: Never Used  Substance and Sexual Activity  . Alcohol use: No  . Drug use: No  . Sexual activity: Not on file  Other Topics Concern  . Not on file  Social History Narrative  . Not on file   Social Determinants of Health   Financial  Resource Strain:   . Difficulty of Paying Living Expenses: Not on file  Food Insecurity:   . Worried About Charity fundraiser in the Last Year: Not on file  . Ran Out of Food in the Last Year: Not on file  Transportation Needs:   . Lack of Transportation (Medical): Not on file  . Lack of Transportation (Non-Medical): Not on file  Physical Activity:   . Days of Exercise per Week: Not on file  . Minutes of Exercise per Session: Not on file  Stress:   . Feeling of Stress : Not on file  Social Connections:   . Frequency of Communication with Friends and Family: Not on file  . Frequency of Social Gatherings with Friends and Family: Not on file  . Attends Religious Services: Not on file  . Active Member of Clubs or Organizations: Not on file  . Attends Archivist Meetings: Not on file  . Marital Status: Not on file     Family History: The patient's family history includes Heart attack in her father.  ROS:   Please see the history of present illness.     All other systems reviewed and are negative.  EKGs/Labs/Other Studies Reviewed:    The following studies were reviewed today:   EKG:   Aug. 24, 2021: 06/06/2020: Normal sinus rhythm with frequent premature atrial contractions.  No ST or T wave abnormalities.    Recent Labs: No results found for requested labs within last 8760 hours.  Recent Lipid Panel No results found for: CHOL, TRIG, HDL, CHOLHDL, VLDL, LDLCALC, LDLDIRECT  Physical Exam:    VS:  BP 118/84   Pulse 88   Ht 5\' 6"  (1.676 m)   Wt 175 lb 6.4 oz (79.6 kg)   SpO2 95%   BMI 28.31 kg/m     Wt Readings from Last 3 Encounters:  06/06/20 175 lb 6.4 oz (79.6 kg)  02/01/20 181 lb 1.6 oz (82.1 kg)  07/05/19 179 lb 1.6 oz (81.2 kg)     GEN:  Well nourished, well developed in no acute distress HEENT: Normal NECK: No JVD; No carotid bruits LYMPHATICS: No lymphadenopathy CARDIAC: Irreg. Irreg.  RESPIRATORY:  Clear to auscultation without rales,  wheezing or rhonchi  ABDOMEN: Soft, non-tender, non-distended MUSCULOSKELETAL:  No edema; No deformity  SKIN: Warm and dry NEUROLOGIC:  Alert and oriented x 3 PSYCHIATRIC:  Normal affect   ASSESSMENT:    1. PAC (premature atrial contraction)    PLAN:    In order of problems listed above:  1. HTN:  BP is much better since the addition of amlodipine .  Would continue current meds.   2.   HR irregularities:  Had frequent PACs last week by ECG  Repeat ECG confirms.   I suggest that she continue to get ECGs yearly to monitor her HR  She is asymptomatic If she develops symptoms, we can consider the addition of low dose beta blocker.   I will see her on an as needed basis   Medication Adjustments/Labs and Tests Ordered: Current medicines are reviewed at length with the patient today.  Concerns regarding medicines are outlined above.  Orders Placed This Encounter  Procedures  . EKG 12-Lead   No orders of the defined types were placed in this encounter.   Patient Instructions  Medication Instructions:  Your physician recommends that you continue on your current medications as directed. Please refer to the Current Medication list given to you today.  *If you need a refill on your cardiac medications before your next appointment, please call your pharmacy*   Lab Work: None Ordered If you have labs (blood work) drawn today and your tests are completely normal, you will receive your results only by: Marland Kitchen MyChart Message (if you have MyChart) OR . A paper copy in the mail If you have any lab test that is abnormal or we need to change your treatment, we will call you to review the results.   Testing/Procedures: None Ordered   Follow-Up: At [facility], you and your health needs are our priority.  As part of our continuing mission to provide you with exceptional heart care, we have created designated Provider Care Teams.  These Care Teams include your primary Cardiologist  (physician) and Advanced Practice Providers (APPs -  Physician Assistants and Nurse Practitioners) who all work together to provide you with the care you need, when you need it.  Your next appointment:    As Needed  The format for your next appointment:   In Person  Provider:   You may see Mertie Moores, MD or one of the following Advanced Practice Providers on your designated Care Team:    Richardson Dopp, PA-C  Robbie Lis, [place]        Signed, Mertie Moores, MD  06/06/2020 5:41 PM    [place]

## 2020-06-06 NOTE — Patient Instructions (Signed)
Medication Instructions:  Your physician recommends that you continue on your current medications as directed. Please refer to the Current Medication list given to you today.  *If you need a refill on your cardiac medications before your next appointment, please call your pharmacy*   Lab Work: None Ordered If you have labs (blood work) drawn today and your tests are completely normal, you will receive your results only by: MyChart Message (if you have MyChart) OR A paper copy in the mail If you have any lab test that is abnormal or we need to change your treatment, we will call you to review the results.   Testing/Procedures: None Ordered   Follow-Up: At [facility], you and your health needs are our priority.  As part of our continuing mission to provide you with exceptional heart care, we have created designated Provider Care Teams.  These Care Teams include your primary Cardiologist (physician) and Advanced Practice Providers (APPs -  Physician Assistants and Nurse Practitioners) who all work together to provide you with the care you need, when you need it.  Your next appointment:   As Needed  The format for your next appointment:   In Person  Provider:   You may see Philip Nahser, MD or one of the following Advanced Practice Providers on your designated Care Team:   Scott Weaver, PA-C Vin Bhagat, PA-C   

## 2020-07-03 ENCOUNTER — Other Ambulatory Visit: Payer: Self-pay | Admitting: Oncology

## 2020-07-28 DIAGNOSIS — R7303 Prediabetes: Secondary | ICD-10-CM | POA: Diagnosis not present

## 2020-07-28 DIAGNOSIS — Z23 Encounter for immunization: Secondary | ICD-10-CM | POA: Diagnosis not present

## 2020-07-28 DIAGNOSIS — Z0000 Encounter for general adult medical examination without abnormal findings: Secondary | ICD-10-CM | POA: Diagnosis not present

## 2020-07-28 DIAGNOSIS — M8588 Other specified disorders of bone density and structure, other site: Secondary | ICD-10-CM | POA: Diagnosis not present

## 2020-07-28 DIAGNOSIS — Z8639 Personal history of other endocrine, nutritional and metabolic disease: Secondary | ICD-10-CM | POA: Diagnosis not present

## 2020-07-28 DIAGNOSIS — I10 Essential (primary) hypertension: Secondary | ICD-10-CM | POA: Diagnosis not present

## 2020-07-28 DIAGNOSIS — D0510 Intraductal carcinoma in situ of unspecified breast: Secondary | ICD-10-CM | POA: Diagnosis not present

## 2020-08-16 DIAGNOSIS — M25462 Effusion, left knee: Secondary | ICD-10-CM | POA: Diagnosis not present

## 2020-08-16 DIAGNOSIS — M1712 Unilateral primary osteoarthritis, left knee: Secondary | ICD-10-CM | POA: Diagnosis not present

## 2020-08-25 ENCOUNTER — Other Ambulatory Visit: Payer: Self-pay | Admitting: Oncology

## 2020-08-25 DIAGNOSIS — Z9889 Other specified postprocedural states: Secondary | ICD-10-CM

## 2020-10-02 ENCOUNTER — Other Ambulatory Visit: Payer: Self-pay

## 2020-10-02 ENCOUNTER — Ambulatory Visit
Admission: RE | Admit: 2020-10-02 | Discharge: 2020-10-02 | Disposition: A | Discharge status: Home / Self Care | Payer: Medicare Other | Source: Ambulatory Visit | Attending: Oncology | Admitting: Oncology

## 2020-10-02 DIAGNOSIS — R928 Other abnormal and inconclusive findings on diagnostic imaging of breast: Secondary | ICD-10-CM | POA: Diagnosis not present

## 2020-10-02 DIAGNOSIS — Z9889 Other specified postprocedural states: Secondary | ICD-10-CM

## 2021-03-01 ENCOUNTER — Other Ambulatory Visit: Payer: Self-pay | Admitting: Oncology

## 2021-08-30 ENCOUNTER — Other Ambulatory Visit: Payer: Self-pay | Admitting: Family Medicine

## 2021-09-03 ENCOUNTER — Other Ambulatory Visit: Payer: Self-pay | Admitting: Family Medicine

## 2021-09-03 DIAGNOSIS — Z1231 Encounter for screening mammogram for malignant neoplasm of breast: Secondary | ICD-10-CM

## 2021-09-10 ENCOUNTER — Other Ambulatory Visit: Payer: Self-pay | Admitting: Family Medicine

## 2021-09-10 DIAGNOSIS — N644 Mastodynia: Secondary | ICD-10-CM

## 2021-10-04 ENCOUNTER — Ambulatory Visit: Payer: Medicare Other

## 2021-10-12 DIAGNOSIS — B351 Tinea unguium: Secondary | ICD-10-CM | POA: Diagnosis not present

## 2021-10-24 ENCOUNTER — Ambulatory Visit
Admission: RE | Admit: 2021-10-24 | Discharge: 2021-10-24 | Disposition: A | Discharge status: Home / Self Care | Payer: Medicare PPO | Source: Ambulatory Visit | Attending: Family Medicine | Admitting: Family Medicine

## 2021-10-24 ENCOUNTER — Ambulatory Visit: Payer: Medicare Other

## 2021-10-24 DIAGNOSIS — R922 Inconclusive mammogram: Secondary | ICD-10-CM | POA: Diagnosis not present

## 2021-10-24 DIAGNOSIS — N644 Mastodynia: Secondary | ICD-10-CM

## 2022-04-03 DIAGNOSIS — M5136 Other intervertebral disc degeneration, lumbar region: Secondary | ICD-10-CM | POA: Diagnosis not present

## 2022-04-03 DIAGNOSIS — M1712 Unilateral primary osteoarthritis, left knee: Secondary | ICD-10-CM | POA: Diagnosis not present

## 2022-05-10 DIAGNOSIS — M19072 Primary osteoarthritis, left ankle and foot: Secondary | ICD-10-CM | POA: Diagnosis not present

## 2022-05-10 DIAGNOSIS — M2012 Hallux valgus (acquired), left foot: Secondary | ICD-10-CM | POA: Diagnosis not present

## 2022-05-10 DIAGNOSIS — M79661 Pain in right lower leg: Secondary | ICD-10-CM | POA: Diagnosis not present

## 2022-05-17 DIAGNOSIS — Z23 Encounter for immunization: Secondary | ICD-10-CM | POA: Diagnosis not present

## 2022-05-17 DIAGNOSIS — T63461A Toxic effect of venom of wasps, accidental (unintentional), initial encounter: Secondary | ICD-10-CM | POA: Diagnosis not present

## 2022-05-24 DIAGNOSIS — M79675 Pain in left toe(s): Secondary | ICD-10-CM | POA: Diagnosis not present

## 2022-06-28 DIAGNOSIS — H524 Presbyopia: Secondary | ICD-10-CM | POA: Diagnosis not present

## 2022-06-28 DIAGNOSIS — H25813 Combined forms of age-related cataract, bilateral: Secondary | ICD-10-CM | POA: Diagnosis not present

## 2022-06-28 DIAGNOSIS — H35363 Drusen (degenerative) of macula, bilateral: Secondary | ICD-10-CM | POA: Diagnosis not present

## 2022-06-28 DIAGNOSIS — H35033 Hypertensive retinopathy, bilateral: Secondary | ICD-10-CM | POA: Diagnosis not present

## 2022-08-09 DIAGNOSIS — M8580 Other specified disorders of bone density and structure, unspecified site: Secondary | ICD-10-CM | POA: Diagnosis not present

## 2022-08-09 DIAGNOSIS — Z8639 Personal history of other endocrine, nutritional and metabolic disease: Secondary | ICD-10-CM | POA: Diagnosis not present

## 2022-08-09 DIAGNOSIS — Z0000 Encounter for general adult medical examination without abnormal findings: Secondary | ICD-10-CM | POA: Diagnosis not present

## 2022-08-09 DIAGNOSIS — L609 Nail disorder, unspecified: Secondary | ICD-10-CM | POA: Diagnosis not present

## 2022-08-09 DIAGNOSIS — I10 Essential (primary) hypertension: Secondary | ICD-10-CM | POA: Diagnosis not present

## 2022-08-09 DIAGNOSIS — D0510 Intraductal carcinoma in situ of unspecified breast: Secondary | ICD-10-CM | POA: Diagnosis not present

## 2022-08-09 DIAGNOSIS — R7303 Prediabetes: Secondary | ICD-10-CM | POA: Diagnosis not present

## 2022-08-15 DIAGNOSIS — M8580 Other specified disorders of bone density and structure, unspecified site: Secondary | ICD-10-CM | POA: Diagnosis not present

## 2022-08-15 DIAGNOSIS — Z8639 Personal history of other endocrine, nutritional and metabolic disease: Secondary | ICD-10-CM | POA: Diagnosis not present

## 2022-08-15 DIAGNOSIS — Z23 Encounter for immunization: Secondary | ICD-10-CM | POA: Diagnosis not present

## 2022-08-15 DIAGNOSIS — Z0000 Encounter for general adult medical examination without abnormal findings: Secondary | ICD-10-CM | POA: Diagnosis not present

## 2022-08-15 DIAGNOSIS — D0510 Intraductal carcinoma in situ of unspecified breast: Secondary | ICD-10-CM | POA: Diagnosis not present

## 2022-08-15 DIAGNOSIS — R7303 Prediabetes: Secondary | ICD-10-CM | POA: Diagnosis not present

## 2022-08-16 ENCOUNTER — Other Ambulatory Visit: Payer: Self-pay | Admitting: Family Medicine

## 2022-08-16 DIAGNOSIS — M8580 Other specified disorders of bone density and structure, unspecified site: Secondary | ICD-10-CM

## 2022-09-23 ENCOUNTER — Other Ambulatory Visit: Payer: Self-pay | Admitting: Family Medicine

## 2022-09-23 DIAGNOSIS — Z1231 Encounter for screening mammogram for malignant neoplasm of breast: Secondary | ICD-10-CM

## 2022-10-25 ENCOUNTER — Ambulatory Visit: Payer: Medicare PPO

## 2022-10-25 ENCOUNTER — Ambulatory Visit
Admission: RE | Admit: 2022-10-25 | Discharge: 2022-10-25 | Disposition: A | Discharge status: Home / Self Care | Payer: Medicare PPO | Source: Ambulatory Visit | Attending: Family Medicine | Admitting: Family Medicine

## 2022-10-25 DIAGNOSIS — Z1231 Encounter for screening mammogram for malignant neoplasm of breast: Secondary | ICD-10-CM

## 2022-10-31 DIAGNOSIS — M25512 Pain in left shoulder: Secondary | ICD-10-CM | POA: Diagnosis not present

## 2022-10-31 DIAGNOSIS — M549 Dorsalgia, unspecified: Secondary | ICD-10-CM | POA: Diagnosis not present

## 2022-11-14 DIAGNOSIS — M25512 Pain in left shoulder: Secondary | ICD-10-CM | POA: Diagnosis not present

## 2022-11-14 DIAGNOSIS — M545 Low back pain, unspecified: Secondary | ICD-10-CM | POA: Diagnosis not present

## 2022-11-19 DIAGNOSIS — S39012A Strain of muscle, fascia and tendon of lower back, initial encounter: Secondary | ICD-10-CM | POA: Diagnosis not present

## 2022-11-26 DIAGNOSIS — S39012D Strain of muscle, fascia and tendon of lower back, subsequent encounter: Secondary | ICD-10-CM | POA: Diagnosis not present

## 2022-11-26 DIAGNOSIS — M6281 Muscle weakness (generalized): Secondary | ICD-10-CM | POA: Diagnosis not present

## 2022-12-03 DIAGNOSIS — S39012D Strain of muscle, fascia and tendon of lower back, subsequent encounter: Secondary | ICD-10-CM | POA: Diagnosis not present

## 2022-12-03 DIAGNOSIS — M6281 Muscle weakness (generalized): Secondary | ICD-10-CM | POA: Diagnosis not present

## 2022-12-06 DIAGNOSIS — M6281 Muscle weakness (generalized): Secondary | ICD-10-CM | POA: Diagnosis not present

## 2022-12-06 DIAGNOSIS — S39012D Strain of muscle, fascia and tendon of lower back, subsequent encounter: Secondary | ICD-10-CM | POA: Diagnosis not present

## 2022-12-11 DIAGNOSIS — M6281 Muscle weakness (generalized): Secondary | ICD-10-CM | POA: Diagnosis not present

## 2022-12-11 DIAGNOSIS — S39012D Strain of muscle, fascia and tendon of lower back, subsequent encounter: Secondary | ICD-10-CM | POA: Diagnosis not present

## 2022-12-13 DIAGNOSIS — S39012D Strain of muscle, fascia and tendon of lower back, subsequent encounter: Secondary | ICD-10-CM | POA: Diagnosis not present

## 2022-12-13 DIAGNOSIS — M6281 Muscle weakness (generalized): Secondary | ICD-10-CM | POA: Diagnosis not present

## 2022-12-17 DIAGNOSIS — M6281 Muscle weakness (generalized): Secondary | ICD-10-CM | POA: Diagnosis not present

## 2022-12-17 DIAGNOSIS — S39012D Strain of muscle, fascia and tendon of lower back, subsequent encounter: Secondary | ICD-10-CM | POA: Diagnosis not present

## 2022-12-19 DIAGNOSIS — M6281 Muscle weakness (generalized): Secondary | ICD-10-CM | POA: Diagnosis not present

## 2022-12-19 DIAGNOSIS — M5442 Lumbago with sciatica, left side: Secondary | ICD-10-CM | POA: Diagnosis not present

## 2022-12-19 DIAGNOSIS — S39012D Strain of muscle, fascia and tendon of lower back, subsequent encounter: Secondary | ICD-10-CM | POA: Diagnosis not present

## 2022-12-31 DIAGNOSIS — M6281 Muscle weakness (generalized): Secondary | ICD-10-CM | POA: Diagnosis not present

## 2022-12-31 DIAGNOSIS — S39012D Strain of muscle, fascia and tendon of lower back, subsequent encounter: Secondary | ICD-10-CM | POA: Diagnosis not present

## 2023-01-08 DIAGNOSIS — S39012D Strain of muscle, fascia and tendon of lower back, subsequent encounter: Secondary | ICD-10-CM | POA: Diagnosis not present

## 2023-01-08 DIAGNOSIS — M6281 Muscle weakness (generalized): Secondary | ICD-10-CM | POA: Diagnosis not present

## 2023-01-13 DIAGNOSIS — M6281 Muscle weakness (generalized): Secondary | ICD-10-CM | POA: Diagnosis not present

## 2023-01-13 DIAGNOSIS — S39012D Strain of muscle, fascia and tendon of lower back, subsequent encounter: Secondary | ICD-10-CM | POA: Diagnosis not present

## 2023-01-21 DIAGNOSIS — M6281 Muscle weakness (generalized): Secondary | ICD-10-CM | POA: Diagnosis not present

## 2023-01-21 DIAGNOSIS — S39012D Strain of muscle, fascia and tendon of lower back, subsequent encounter: Secondary | ICD-10-CM | POA: Diagnosis not present

## 2023-01-23 DIAGNOSIS — M6281 Muscle weakness (generalized): Secondary | ICD-10-CM | POA: Diagnosis not present

## 2023-01-23 DIAGNOSIS — S39012D Strain of muscle, fascia and tendon of lower back, subsequent encounter: Secondary | ICD-10-CM | POA: Diagnosis not present

## 2023-02-07 ENCOUNTER — Ambulatory Visit
Admission: RE | Admit: 2023-02-07 | Discharge: 2023-02-07 | Disposition: A | Discharge status: Home / Self Care | Payer: Medicare PPO | Source: Ambulatory Visit | Attending: Family Medicine | Admitting: Family Medicine

## 2023-02-07 DIAGNOSIS — M85852 Other specified disorders of bone density and structure, left thigh: Secondary | ICD-10-CM | POA: Diagnosis not present

## 2023-02-07 DIAGNOSIS — M8580 Other specified disorders of bone density and structure, unspecified site: Secondary | ICD-10-CM

## 2023-02-07 DIAGNOSIS — Z780 Asymptomatic menopausal state: Secondary | ICD-10-CM | POA: Diagnosis not present

## 2023-02-10 DIAGNOSIS — M67911 Unspecified disorder of synovium and tendon, right shoulder: Secondary | ICD-10-CM | POA: Diagnosis not present

## 2023-02-14 DIAGNOSIS — M7541 Impingement syndrome of right shoulder: Secondary | ICD-10-CM | POA: Diagnosis not present

## 2023-02-14 DIAGNOSIS — R059 Cough, unspecified: Secondary | ICD-10-CM | POA: Diagnosis not present

## 2023-02-14 DIAGNOSIS — Z03818 Encounter for observation for suspected exposure to other biological agents ruled out: Secondary | ICD-10-CM | POA: Diagnosis not present

## 2023-02-14 DIAGNOSIS — J019 Acute sinusitis, unspecified: Secondary | ICD-10-CM | POA: Diagnosis not present

## 2023-02-14 DIAGNOSIS — M25611 Stiffness of right shoulder, not elsewhere classified: Secondary | ICD-10-CM | POA: Diagnosis not present

## 2023-02-19 DIAGNOSIS — M25611 Stiffness of right shoulder, not elsewhere classified: Secondary | ICD-10-CM | POA: Diagnosis not present

## 2023-02-19 DIAGNOSIS — M7541 Impingement syndrome of right shoulder: Secondary | ICD-10-CM | POA: Diagnosis not present

## 2023-02-21 DIAGNOSIS — M25611 Stiffness of right shoulder, not elsewhere classified: Secondary | ICD-10-CM | POA: Diagnosis not present

## 2023-02-21 DIAGNOSIS — M7541 Impingement syndrome of right shoulder: Secondary | ICD-10-CM | POA: Diagnosis not present

## 2023-02-25 DIAGNOSIS — M67911 Unspecified disorder of synovium and tendon, right shoulder: Secondary | ICD-10-CM | POA: Diagnosis not present

## 2023-02-27 DIAGNOSIS — M25611 Stiffness of right shoulder, not elsewhere classified: Secondary | ICD-10-CM | POA: Diagnosis not present

## 2023-02-27 DIAGNOSIS — M7541 Impingement syndrome of right shoulder: Secondary | ICD-10-CM | POA: Diagnosis not present

## 2023-03-04 DIAGNOSIS — M7541 Impingement syndrome of right shoulder: Secondary | ICD-10-CM | POA: Diagnosis not present

## 2023-03-04 DIAGNOSIS — M25611 Stiffness of right shoulder, not elsewhere classified: Secondary | ICD-10-CM | POA: Diagnosis not present

## 2023-03-07 DIAGNOSIS — M7541 Impingement syndrome of right shoulder: Secondary | ICD-10-CM | POA: Diagnosis not present

## 2023-03-07 DIAGNOSIS — M25611 Stiffness of right shoulder, not elsewhere classified: Secondary | ICD-10-CM | POA: Diagnosis not present

## 2023-03-11 DIAGNOSIS — M7541 Impingement syndrome of right shoulder: Secondary | ICD-10-CM | POA: Diagnosis not present

## 2023-03-11 DIAGNOSIS — M25611 Stiffness of right shoulder, not elsewhere classified: Secondary | ICD-10-CM | POA: Diagnosis not present

## 2023-03-12 DIAGNOSIS — M25611 Stiffness of right shoulder, not elsewhere classified: Secondary | ICD-10-CM | POA: Diagnosis not present

## 2023-03-12 DIAGNOSIS — M7541 Impingement syndrome of right shoulder: Secondary | ICD-10-CM | POA: Diagnosis not present

## 2023-04-03 DIAGNOSIS — R5381 Other malaise: Secondary | ICD-10-CM | POA: Diagnosis not present

## 2023-04-03 DIAGNOSIS — J029 Acute pharyngitis, unspecified: Secondary | ICD-10-CM | POA: Diagnosis not present

## 2023-04-03 DIAGNOSIS — Z03818 Encounter for observation for suspected exposure to other biological agents ruled out: Secondary | ICD-10-CM | POA: Diagnosis not present

## 2023-05-16 DIAGNOSIS — Z20822 Contact with and (suspected) exposure to covid-19: Secondary | ICD-10-CM | POA: Diagnosis not present

## 2023-09-03 DIAGNOSIS — H3561 Retinal hemorrhage, right eye: Secondary | ICD-10-CM | POA: Diagnosis not present

## 2023-09-03 DIAGNOSIS — H25813 Combined forms of age-related cataract, bilateral: Secondary | ICD-10-CM | POA: Diagnosis not present

## 2023-09-03 DIAGNOSIS — H524 Presbyopia: Secondary | ICD-10-CM | POA: Diagnosis not present

## 2023-09-03 DIAGNOSIS — H35033 Hypertensive retinopathy, bilateral: Secondary | ICD-10-CM | POA: Diagnosis not present

## 2023-09-03 DIAGNOSIS — H35012 Changes in retinal vascular appearance, left eye: Secondary | ICD-10-CM | POA: Diagnosis not present

## 2023-09-22 DIAGNOSIS — Z0000 Encounter for general adult medical examination without abnormal findings: Secondary | ICD-10-CM | POA: Diagnosis not present

## 2023-09-22 DIAGNOSIS — Z03818 Encounter for observation for suspected exposure to other biological agents ruled out: Secondary | ICD-10-CM | POA: Diagnosis not present

## 2023-09-22 DIAGNOSIS — R7303 Prediabetes: Secondary | ICD-10-CM | POA: Diagnosis not present

## 2023-09-22 DIAGNOSIS — B349 Viral infection, unspecified: Secondary | ICD-10-CM | POA: Diagnosis not present

## 2023-09-22 DIAGNOSIS — R051 Acute cough: Secondary | ICD-10-CM | POA: Diagnosis not present

## 2023-09-22 DIAGNOSIS — I10 Essential (primary) hypertension: Secondary | ICD-10-CM | POA: Diagnosis not present

## 2023-09-22 DIAGNOSIS — Z8639 Personal history of other endocrine, nutritional and metabolic disease: Secondary | ICD-10-CM | POA: Diagnosis not present

## 2023-09-25 DIAGNOSIS — R7303 Prediabetes: Secondary | ICD-10-CM | POA: Diagnosis not present

## 2023-09-25 DIAGNOSIS — D0510 Intraductal carcinoma in situ of unspecified breast: Secondary | ICD-10-CM | POA: Diagnosis not present

## 2023-09-25 DIAGNOSIS — M8580 Other specified disorders of bone density and structure, unspecified site: Secondary | ICD-10-CM | POA: Diagnosis not present

## 2023-09-25 DIAGNOSIS — I10 Essential (primary) hypertension: Secondary | ICD-10-CM | POA: Diagnosis not present

## 2023-09-25 DIAGNOSIS — Z8639 Personal history of other endocrine, nutritional and metabolic disease: Secondary | ICD-10-CM | POA: Diagnosis not present

## 2023-09-25 DIAGNOSIS — L309 Dermatitis, unspecified: Secondary | ICD-10-CM | POA: Diagnosis not present

## 2023-09-25 DIAGNOSIS — Z0000 Encounter for general adult medical examination without abnormal findings: Secondary | ICD-10-CM | POA: Diagnosis not present

## 2023-09-25 DIAGNOSIS — R002 Palpitations: Secondary | ICD-10-CM | POA: Diagnosis not present

## 2023-09-25 DIAGNOSIS — J988 Other specified respiratory disorders: Secondary | ICD-10-CM | POA: Diagnosis not present

## 2023-10-01 ENCOUNTER — Other Ambulatory Visit: Payer: Self-pay | Admitting: Family Medicine

## 2023-10-01 DIAGNOSIS — Z1231 Encounter for screening mammogram for malignant neoplasm of breast: Secondary | ICD-10-CM

## 2023-10-27 ENCOUNTER — Ambulatory Visit
Admission: RE | Admit: 2023-10-27 | Discharge: 2023-10-27 | Disposition: A | Discharge status: Home / Self Care | Payer: Medicare PPO | Source: Ambulatory Visit | Attending: Family Medicine | Admitting: Family Medicine

## 2023-10-27 DIAGNOSIS — Z1231 Encounter for screening mammogram for malignant neoplasm of breast: Secondary | ICD-10-CM

## 2023-11-21 DIAGNOSIS — R7303 Prediabetes: Secondary | ICD-10-CM | POA: Diagnosis not present

## 2023-11-21 DIAGNOSIS — L609 Nail disorder, unspecified: Secondary | ICD-10-CM | POA: Diagnosis not present

## 2023-11-24 DIAGNOSIS — R0781 Pleurodynia: Secondary | ICD-10-CM | POA: Diagnosis not present

## 2023-12-01 DIAGNOSIS — H35033 Hypertensive retinopathy, bilateral: Secondary | ICD-10-CM | POA: Diagnosis not present

## 2023-12-01 DIAGNOSIS — H3561 Retinal hemorrhage, right eye: Secondary | ICD-10-CM | POA: Diagnosis not present

## 2023-12-05 ENCOUNTER — Ambulatory Visit: Payer: Medicare PPO | Admitting: Podiatry

## 2023-12-08 ENCOUNTER — Ambulatory Visit: Payer: Medicare PPO | Admitting: Podiatry

## 2023-12-08 ENCOUNTER — Encounter: Payer: Self-pay | Admitting: Podiatry

## 2023-12-08 DIAGNOSIS — B351 Tinea unguium: Secondary | ICD-10-CM | POA: Diagnosis not present

## 2023-12-08 DIAGNOSIS — L608 Other nail disorders: Secondary | ICD-10-CM

## 2023-12-08 DIAGNOSIS — A499 Bacterial infection, unspecified: Secondary | ICD-10-CM | POA: Diagnosis not present

## 2023-12-08 DIAGNOSIS — B49 Unspecified mycosis: Secondary | ICD-10-CM

## 2023-12-09 NOTE — Progress Notes (Signed)
 Subjective:   Patient ID: Linda Singh, female   DOB: 83 y.o.   MRN: [id number]   HPI Chief Complaint  Patient presents with   Nail Problem    RM#13 Patient has spot on right foot big toe nail has been present for a while.   83 year old female presents the office today for concerns of right big toenail discoloration.  She is approximately intraoperative patient hit her toe previously.  She denies any recent treatment of the toenail.  No current pain of the nail no swelling redness or any drainage.   Review of Systems  All other systems reviewed and are negative.  Past Medical History:  Diagnosis Date   Breast cancer ([facility])    Breast cancer, left breast ([facility]) 11/02/14   Upper, Outer Quadrant   Constipation    DCIS (ductal carcinoma in situ) of breast    Hearing difficulty    History of colon polyps    Hyperlipidemia    Hypertension    hx   Osteopenia    Personal history of radiation therapy    Pre-diabetes    S/P radiation therapy 12/29/2014 through 02/09/2015                                                         Left breast 4600 cGy in 23 sessions left breast boost 1400 cGy in 7 sessions                         Wears glasses    Wears partial dentures     Past Surgical History:  Procedure Laterality Date   ABDOMINAL HYSTERECTOMY     BREAST LUMPECTOMY Left    2016   COLONOSCOPY     RADIOACTIVE SEED GUIDED PARTIAL MASTECTOMY WITH AXILLARY SENTINEL LYMPH NODE BIOPSY Left 11/02/2014   Procedure: RADIOACTIVE SEED GUIDED PARTIAL MASTECTOMY WITH AXILLARY SENTINEL LYMPH NODE BIOPSY;  Surgeon: Almond Lint, MD;  Location: [facility];  Service: General;  Laterality: Left;   RE-EXCISION OF BREAST LUMPECTOMY Left 11/17/2014   Procedure: RE-EXCISION OF LEFT BREAST LUMPECTOMY;  Surgeon: Almond Lint, MD;  Location: [facility];  Service: General;  Laterality: Left;     Current Outpatient Medications:    AMLODIPINE BESYLATE PO, Take 5 mg by mouth., Disp: , Rfl:    Ascorbic  Acid (VITAMIN C) 1000 MG tablet, Take 1,000 mg by mouth daily., Disp: , Rfl:    b complex vitamins tablet, Take 1 tablet by mouth daily., Disp: , Rfl:    Biotin 10 MG TABS, Take by mouth., Disp: , Rfl:    calcium carbonate (OS-CAL) 600 MG TABS tablet, Take 600 mg by mouth 2 (two) times daily with a meal., Disp: , Rfl:    Calcium Carbonate-Vitamin D (CALCIUM 600+D PO), Take by mouth., Disp: , Rfl:    cholecalciferol (VITAMIN D3) 25 MCG (1000 UNIT) tablet, TAKE 1 TABLET BY MOUTH EVERY DAY, Disp: 100 tablet, Rfl: 6   hydrochlorothiazide (HYDRODIURIL) 25 MG tablet, Take 25 mg by mouth daily., Disp: , Rfl:    ibuprofen (ADVIL,MOTRIN) 200 MG tablet, Take 200 mg by mouth every 6 (six) hours as needed., Disp: , Rfl:    Magnesium 400 MG CAPS, Take by mouth., Disp: , Rfl:    Omega-3 1000 MG CAPS, Take  1 capsule by mouth daily., Disp: , Rfl:    silver sulfADIAZINE (SILVADENE) 1 % cream, Apply 1 application topically daily., Disp: , Rfl:    Turmeric 500 MG CAPS, Take by mouth., Disp: , Rfl:    Zinc 100 MG TABS, Take by mouth., Disp: , Rfl:   Allergies  Allergen Reactions   Amoxicillin     Does not know   Contrast Media [Iodinated Contrast Media] Nausea And Vomiting          Objective:  Physical Exam  General: AAO x3, NAD  Dermatological: The nail itself is hypertrophic and dystrophic and discoloration and there is some subungual debris present.  As pictured below there is a hyperpigmented lesion mostly on the medial nail border.  Clinically the immediate looks like dried blood within the toenail.  There is no extension of hyperpigmentation concerning skin.  No pain to the toenail.  No drainage.       Vascular: Dorsalis Pedis artery and Posterior Tibial artery pedal pulses are 2/4 bilateral with immedate capillary fill time. There is no pain with calf compression, swelling, warmth, erythema.   Neruologic: Grossly intact via light touch bilateral.   Musculoskeletal: No pain.      Assessment:   Toenail discoloration, onychodystrophy     Plan:  -Treatment options discussed including all alternatives, risks, and complications -Etiology of symptoms were discussed -Clinically looks like there is dried blood present under the toenail.  Discussed that should grow out over time however should there be any worsening or spreading we will biopsy the area but she was to hold off on this for now.  I did sharply debride the toenail itself into this for culture and pathology for evaluation of onychomycosis.  Discussed treatment options were ordered results of the culture before proceed with further treatment.  Return for nail culture results.  Vivi Barrack DPM

## 2023-12-11 ENCOUNTER — Ambulatory Visit: Payer: Medicare PPO | Admitting: Podiatry

## 2023-12-22 ENCOUNTER — Other Ambulatory Visit: Payer: Self-pay | Admitting: Podiatry

## 2023-12-29 ENCOUNTER — Encounter: Payer: Self-pay | Admitting: Podiatry

## 2024-01-01 ENCOUNTER — Telehealth: Payer: Self-pay | Admitting: Podiatry

## 2024-01-01 NOTE — Telephone Encounter (Signed)
 Patient is calling, requesting results from labs and would like to talk with Dr. Ardelle Anton regarding  offering cream or some type of medicine for her toe (right big toe) Patient contact telephone number, [phone]

## 2024-02-13 DIAGNOSIS — M65331 Trigger finger, right middle finger: Secondary | ICD-10-CM | POA: Diagnosis not present

## 2024-02-19 DIAGNOSIS — M65331 Trigger finger, right middle finger: Secondary | ICD-10-CM | POA: Diagnosis not present

## 2024-02-25 DIAGNOSIS — M65331 Trigger finger, right middle finger: Secondary | ICD-10-CM | POA: Diagnosis not present

## 2024-05-31 DIAGNOSIS — M65331 Trigger finger, right middle finger: Secondary | ICD-10-CM | POA: Diagnosis not present

## 2024-09-24 ENCOUNTER — Other Ambulatory Visit: Payer: Self-pay | Admitting: Family Medicine

## 2024-09-24 DIAGNOSIS — Z1231 Encounter for screening mammogram for malignant neoplasm of breast: Secondary | ICD-10-CM

## 2024-11-05 ENCOUNTER — Ambulatory Visit
Admission: RE | Admit: 2024-11-05 | Discharge: 2024-11-05 | Disposition: A | Source: Ambulatory Visit | Attending: Family Medicine | Admitting: Family Medicine

## 2024-11-05 DIAGNOSIS — Z1231 Encounter for screening mammogram for malignant neoplasm of breast: Secondary | ICD-10-CM

## 2024-11-08 ENCOUNTER — Ambulatory Visit
